# Patient Record
Sex: Male | Born: 1960 | Race: Black or African American | Hispanic: No | Marital: Single | Smoking: Current every day smoker
Health system: Southern US, Community
[De-identification: ages and names within clinical notes are randomized; demographics above are authoritative.]

## PROBLEM LIST (undated history)

## (undated) DIAGNOSIS — I10 Essential (primary) hypertension: Secondary | ICD-10-CM

## (undated) HISTORY — PX: CYST REMOVAL TRUNK: SHX6283

---

## 2005-01-30 ENCOUNTER — Emergency Department (HOSPITAL_COMMUNITY): Admission: EM | Admit: 2005-01-30 | Discharge: 2005-01-30 | Payer: Self-pay | Admitting: Emergency Medicine

## 2005-07-18 ENCOUNTER — Emergency Department (HOSPITAL_COMMUNITY): Admission: EM | Admit: 2005-07-18 | Discharge: 2005-07-18 | Payer: Self-pay | Admitting: Emergency Medicine

## 2005-07-26 ENCOUNTER — Emergency Department (HOSPITAL_COMMUNITY): Admission: EM | Admit: 2005-07-26 | Discharge: 2005-07-26 | Payer: Self-pay | Admitting: Emergency Medicine

## 2014-09-11 ENCOUNTER — Emergency Department (HOSPITAL_COMMUNITY): Payer: Self-pay

## 2014-09-11 ENCOUNTER — Emergency Department (HOSPITAL_COMMUNITY)
Admission: EM | Admit: 2014-09-11 | Discharge: 2014-09-12 | Disposition: A | Discharge status: Home / Self Care | Payer: Self-pay | Attending: Emergency Medicine | Admitting: Emergency Medicine

## 2014-09-11 ENCOUNTER — Encounter (HOSPITAL_COMMUNITY): Payer: Self-pay | Admitting: Emergency Medicine

## 2014-09-11 DIAGNOSIS — I10 Essential (primary) hypertension: Secondary | ICD-10-CM | POA: Insufficient documentation

## 2014-09-11 DIAGNOSIS — R6889 Other general symptoms and signs: Secondary | ICD-10-CM

## 2014-09-11 DIAGNOSIS — R509 Fever, unspecified: Secondary | ICD-10-CM | POA: Insufficient documentation

## 2014-09-11 DIAGNOSIS — R062 Wheezing: Secondary | ICD-10-CM | POA: Insufficient documentation

## 2014-09-11 DIAGNOSIS — R51 Headache: Secondary | ICD-10-CM | POA: Insufficient documentation

## 2014-09-11 DIAGNOSIS — R5383 Other fatigue: Secondary | ICD-10-CM | POA: Insufficient documentation

## 2014-09-11 DIAGNOSIS — R05 Cough: Secondary | ICD-10-CM | POA: Insufficient documentation

## 2014-09-11 DIAGNOSIS — R059 Cough, unspecified: Secondary | ICD-10-CM

## 2014-09-11 DIAGNOSIS — R42 Dizziness and giddiness: Secondary | ICD-10-CM | POA: Insufficient documentation

## 2014-09-11 DIAGNOSIS — Z720 Tobacco use: Secondary | ICD-10-CM | POA: Insufficient documentation

## 2014-09-11 HISTORY — DX: Essential (primary) hypertension: I10

## 2014-09-11 MED ORDER — IPRATROPIUM-ALBUTEROL 0.5-2.5 (3) MG/3ML IN SOLN
3.0000 mL | Freq: Once | RESPIRATORY_TRACT | Status: AC
  Administered 2014-09-12: 3 mL via RESPIRATORY_TRACT
  Filled 2014-09-11: qty 3

## 2014-09-11 NOTE — ED Notes (Signed)
Pt. reports productive cough , headache , fatigue , sneezing and mild dizziness onset this week , denies fever or chills.

## 2014-09-11 NOTE — ED Provider Notes (Signed)
CSN: [id number]     Arrival date & time 09/11/14  2012 History  This chart was scribed for Matthew Booze, MD by Evon Slack, ED Scribe. This patient was seen in room A03C/A03C and the patient's care was started at 11:31 PM.      Chief Complaint  Patient presents with  . Cough  . Headache  . Fatigue  . Dizziness   The history is provided by the patient. No language interpreter was used.   HPI Comments: Matthew Bradley is a 54 y.o. male who presents to the Emergency Department complaining of productive cough of white sputum onset 3 days prior. Pt reports fever, sneezing, HA, intermittent dizziness and fatigue. Pt states that today at work his legs have were unusually sore. Pt doesn't report any medications PTA. Pt states that the cough is worse at night. Pt does report recently being around someone with similar virus as work. Denies chills nausea or vomiting. Pt states that he is currently trying to quit smoking and he is down to about 5 cigarettes per weak.   Past Medical History  Diagnosis Date  . Hypertension    History reviewed. No pertinent past surgical history. No family history on file. Social History  Substance Use Topics  . Smoking status: Current Every Day Smoker  . Smokeless tobacco: None  . Alcohol Use: No    Review of Systems  Constitutional: Positive for fever. Negative for chills.  HENT: Positive for sneezing.   Respiratory: Positive for cough.   Neurological: Positive for dizziness and headaches.  All other systems reviewed and are negative.   Allergies  Review of patient's allergies indicates no known allergies.  Home Medications   Prior to Admission medications   Medication Sig Start Date End Date Taking? Authorizing Provider  naphazoline-pheniramine (NAPHCON-A) 0.025-0.3 % ophthalmic solution Place 1 drop into both eyes 4 (four) times daily as needed for irritation.   Yes Historical Provider, MD   BP 144/96 mmHg  Pulse 98  Temp(Src) 100.1 F (37.8  C) (Oral)  Resp 16  Ht 6' 3.75" (1.924 m)  Wt 272 lb (123.378 kg)  BMI 33.33 kg/m2  SpO2 96%   Physical Exam  Constitutional: He is oriented to person, place, and time. He appears well-developed and well-nourished. No distress.  HENT:  Head: Normocephalic and atraumatic.  Eyes: Conjunctivae and EOM are normal. Pupils are equal, round, and reactive to light.  Neck: Normal range of motion. Neck supple. No JVD present.  Cardiovascular: Normal rate, regular rhythm and normal heart sounds.   No murmur heard. Pulmonary/Chest: Effort normal. No respiratory distress. He has wheezes. He has no rales. He exhibits no tenderness.  Slightly prolonged exhalation phase. minimal wheeze with forced exhalation.   Abdominal: Soft. Bowel sounds are normal. He exhibits no distension and no mass. There is no tenderness.  Musculoskeletal: Normal range of motion. He exhibits no edema.  Lymphadenopathy:    He has no cervical adenopathy.  Neurological: He is alert and oriented to person, place, and time. No cranial nerve deficit. He exhibits normal muscle tone. Coordination normal.  Skin: Skin is warm and dry. No rash noted.  Psychiatric: He has a normal mood and affect. His behavior is normal. Judgment and thought content normal.  Nursing note and vitals reviewed.   ED Course  Procedures (including critical care time) DIAGNOSTIC STUDIES: Oxygen Saturation is 96% on RA, adequate by my interpretation.    COORDINATION OF CARE: 11:36 PM-Discussed treatment plan with pt at bedside and pt  agreed to plan.     Imaging Review Dg Chest 2 View  09/11/2014   CLINICAL DATA:  Productive cough  EXAM: CHEST  2 VIEW  COMPARISON:  None.  FINDINGS: The heart size and mediastinal contours are within normal limits. Both lungs are clear. The visualized skeletal structures are unremarkable. Aortic unfolding noted.  IMPRESSION: No active cardiopulmonary disease.   Electronically Signed   By: Christiana Pellant M.D.   On:  09/11/2014 21:29   I have personally reviewed and evaluated these images as part of my medical decision-making.   MDM   Final diagnoses:  Flu-like symptoms  Cough      Flulike illness. Chest x-ray shows no evidence of pneumonia. He will be given a therapeutic trial of albuterol with ipratropium to see if it helps suppress his cough.   He feels significantly better after albuterol with ipratropium. He is discharged with prescriptions for albuterol, prednisone, and hydrocodone-acetaminophen as a cough suppressant to use at night.  I personally performed the services described in this documentation, which was scribed in my presence. The recorded information has been reviewed and is accurate.       Matthew Booze, MD 09/12/14 [phone]

## 2014-09-12 MED ORDER — PREDNISONE 50 MG PO TABS: 50.0000 mg | ORAL_TABLET | Freq: Every day | ORAL | Status: DC

## 2014-09-12 MED ORDER — HYDROCODONE-ACETAMINOPHEN 5-325 MG PO TABS: 1.0000 | ORAL_TABLET | ORAL | Status: DC | PRN

## 2014-09-12 MED ORDER — ALBUTEROL SULFATE HFA 108 (90 BASE) MCG/ACT IN AERS: 2.0000 | INHALATION_SPRAY | RESPIRATORY_TRACT | Status: DC | PRN

## 2014-09-12 MED ORDER — PREDNISONE 20 MG PO TABS
60.0000 mg | ORAL_TABLET | Freq: Once | ORAL | Status: AC
  Administered 2014-09-12: 60 mg via ORAL
  Filled 2014-09-12: qty 3

## 2014-09-12 NOTE — ED Notes (Signed)
Patient is alert and orientedx4.  Patient was explained discharge instructions and they understood them with no questions.   

## 2014-09-12 NOTE — ED Notes (Signed)
MD aware of BP and temp.  Patient was told to take tylenol or ibuprofen for the low grade temp.  His temperature did go down to 99.1 from 100.1

## 2014-09-12 NOTE — Discharge Instructions (Signed)
Drink plenty of fluids. Take acetaminophen or ibuprofen as needed for fever or aching.  Cough, Adult  A cough is a reflex that helps clear your throat and airways. It can help heal the body or may be a reaction to an irritated airway. A cough may only last 2 or 3 weeks (acute) or may last more than 8 weeks (chronic).  CAUSES Acute cough:  Viral or bacterial infections. Chronic cough:  Infections.  Allergies.  Asthma.  Post-nasal drip.  Smoking.  Heartburn or acid reflux.  Some medicines.  Chronic lung problems (COPD).  Cancer. SYMPTOMS   Cough.  Fever.  Chest pain.  Increased breathing rate.  High-pitched whistling sound when breathing (wheezing).  Colored mucus that you cough up (sputum). TREATMENT   A bacterial cough may be treated with antibiotic medicine.  A viral cough must run its course and will not respond to antibiotics.  Your caregiver may recommend other treatments if you have a chronic cough. HOME CARE INSTRUCTIONS   Only take over-the-counter or prescription medicines for pain, discomfort, or fever as directed by your caregiver. Use cough suppressants only as directed by your caregiver.  Use a cold steam vaporizer or humidifier in your bedroom or home to help loosen secretions.  Sleep in a semi-upright position if your cough is worse at night.  Rest as needed.  Stop smoking if you smoke. SEEK IMMEDIATE MEDICAL CARE IF:   You have pus in your sputum.  Your cough starts to worsen.  You cannot control your cough with suppressants and are losing sleep.  You begin coughing up blood.  You have difficulty breathing.  You develop pain which is getting worse or is uncontrolled with medicine.  You have a fever. MAKE SURE YOU:   Understand these instructions.  Will watch your condition.  Will get help right away if you are not doing well or get worse. Document Released: 06/28/2010 Document Revised: 03/24/2011 Document Reviewed:  06/28/2010 [facility] Inc[facility] Patient Information 2015 [place], [place]. This information is not intended to replace advice given to you by your health care provider. Make sure you discuss any questions you have with your health care provider.  Albuterol inhalation aerosol What is this medicine? ALBUTEROL (al [address] B[address] ter ole) is a bronchodilator. It helps open up the airways in your lungs to make it easier to breathe. This medicine is used to treat and to prevent bronchospasm. This medicine may be used for other purposes; ask your health care provider or pharmacist if you have questions. COMMON BRAND NAME(S): Proair HFA, Proventil, Proventil HFA, Respirol, Ventolin, Ventolin HFA What should I tell my health care provider before I take this medicine? They need to know if you have any of the following conditions: -diabetes -heart disease or irregular heartbeat -high blood pressure -pheochromocytoma -seizures -thyroid disease -an unusual or allergic reaction to albuterol, levalbuterol, sulfites, other medicines, foods, dyes, or preservatives -pregnant or trying to get pregnant -breast-feeding How should I use this medicine? This medicine is for inhalation through the mouth. Follow the directions on your prescription label. Take your medicine at regular intervals. Do not use more often than directed. Make sure that you are using your inhaler correctly. Ask you doctor or health care provider if you have any questions. Talk to your pediatrician regarding the use of this medicine in children. Special care may be needed. Overdosage: If you think you have taken too much of this medicine contact a poison control center or emergency room at once. NOTE: This medicine is  only for you. Do not share this medicine with others. What if I miss a dose? If you miss a dose, use it as soon as you can. If it is almost time for your next dose, use only that dose. Do not use double or extra doses. What may interact with this  medicine? -anti-infectives like chloroquine and pentamidine -caffeine -cisapride -diuretics -medicines for colds -medicines for depression or for emotional or psychotic conditions -medicines for weight loss including some herbal products -methadone -some antibiotics like clarithromycin, erythromycin, levofloxacin, and linezolid -some heart medicines -steroid hormones like dexamethasone, cortisone, hydrocortisone -theophylline -thyroid hormones This list may not describe all possible interactions. Give your health care provider a list of all the medicines, herbs, non-prescription drugs, or dietary supplements you use. Also tell them if you smoke, drink alcohol, or use illegal drugs. Some items may interact with your medicine. What should I watch for while using this medicine? Tell your doctor or health care professional if your symptoms do not improve. Do not use extra albuterol. If your asthma or bronchitis gets worse while you are using this medicine, call your doctor right away. If your mouth gets dry try chewing sugarless gum or sucking hard candy. Drink water as directed. What side effects may I notice from receiving this medicine? Side effects that you should report to your doctor or health care professional as soon as possible: -allergic reactions like skin rash, itching or hives, swelling of the face, lips, or tongue -breathing problems -chest pain -feeling faint or lightheaded, falls -high blood pressure -irregular heartbeat -fever -muscle cramps or weakness -pain, tingling, numbness in the hands or feet -vomiting Side effects that usually do not require medical attention (report to your doctor or health care professional if they continue or are bothersome): -cough -difficulty sleeping -headache -nervousness or trembling -stomach upset -stuffy or runny nose -throat irritation -unusual taste This list may not describe all possible side effects. Call your doctor for  medical advice about side effects. You may report side effects to FDA at 1-800-FDA-1088. Where should I keep my medicine? Keep out of the reach of children. Store at room temperature between 15 and 30 degrees C (59 and 86 degrees F). The contents are under pressure and may burst when exposed to heat or flame. Do not freeze. This medicine does not work as well if it is too cold. Throw away any unused medicine after the expiration date. Inhalers need to be thrown away after the labeled number of puffs have been used or by the expiration date; whichever comes first. Ventolin HFA should be thrown away 12 months after removing from foil pouch. Check the instructions that come with your medicine. NOTE: This sheet is a summary. It may not cover all possible information. If you have questions about this medicine, talk to your doctor, pharmacist, or health care provider.  2015, Elsevier/Gold Standard. (2012-06-17 10:57:17)  Prednisone tablets What is this medicine? PREDNISONE (PRED ni sone) is a corticosteroid. It is commonly used to treat inflammation of the skin, joints, lungs, and other organs. Common conditions treated include asthma, allergies, and arthritis. It is also used for other conditions, such as blood disorders and diseases of the adrenal glands. This medicine may be used for other purposes; ask your health care provider or pharmacist if you have questions. COMMON BRAND NAME(S): Deltasone, Predone, Sterapred, Sterapred DS What should I tell my health care provider before I take this medicine? They need to know if you have any of these conditions: -  Cushing's syndrome -diabetes -glaucoma -heart disease -high blood pressure -infection (especially a virus infection such as chickenpox, cold sores, or herpes) -kidney disease -liver disease -mental illness -myasthenia gravis -osteoporosis -seizures -stomach or intestine problems -thyroid disease -an unusual or allergic reaction to lactose,  prednisone, other medicines, foods, dyes, or preservatives -pregnant or trying to get pregnant -breast-feeding How should I use this medicine? Take this medicine by mouth with a glass of water. Follow the directions on the prescription label. Take this medicine with food. If you are taking this medicine once a day, take it in the morning. Do not take more medicine than you are told to take. Do not suddenly stop taking your medicine because you may develop a severe reaction. Your doctor will tell you how much medicine to take. If your doctor wants you to stop the medicine, the dose may be slowly lowered over time to avoid any side effects. Talk to your pediatrician regarding the use of this medicine in children. Special care may be needed. Overdosage: If you think you have taken too much of this medicine contact a poison control center or emergency room at once. NOTE: This medicine is only for you. Do not share this medicine with others. What if I miss a dose? If you miss a dose, take it as soon as you can. If it is almost time for your next dose, talk to your doctor or health care professional. You may need to miss a dose or take an extra dose. Do not take double or extra doses without advice. What may interact with this medicine? Do not take this medicine with any of the following medications: -metyrapone -mifepristone This medicine may also interact with the following medications: -aminoglutethimide -amphotericin B -aspirin and aspirin-like medicines -barbiturates -certain medicines for diabetes, like glipizide or glyburide -cholestyramine -cholinesterase inhibitors -cyclosporine -digoxin -diuretics -ephedrine -male hormones, like estrogens and birth control pills -isoniazid -ketoconazole -NSAIDS, medicines for pain and inflammation, like ibuprofen or naproxen -phenytoin -rifampin -toxoids -vaccines -warfarin This list may not describe all possible interactions. Give your  health care provider a list of all the medicines, herbs, non-prescription drugs, or dietary supplements you use. Also tell them if you smoke, drink alcohol, or use illegal drugs. Some items may interact with your medicine. What should I watch for while using this medicine? Visit your doctor or health care professional for regular checks on your progress. If you are taking this medicine over a prolonged period, carry an identification card with your name and address, the type and dose of your medicine, and your doctor's name and address. This medicine may increase your risk of getting an infection. Tell your doctor or health care professional if you are around anyone with measles or chickenpox, or if you develop sores or blisters that do not heal properly. If you are going to have surgery, tell your doctor or health care professional that you have taken this medicine within the last twelve months. Ask your doctor or health care professional about your diet. You may need to lower the amount of salt you eat. This medicine may affect blood sugar levels. If you have diabetes, check with your doctor or health care professional before you change your diet or the dose of your diabetic medicine. What side effects may I notice from receiving this medicine? Side effects that you should report to your doctor or health care professional as soon as possible: -allergic reactions like skin rash, itching or hives, swelling of the face, lips, or  tongue -changes in emotions or moods -changes in vision -depressed mood -eye pain -fever or chills, cough, sore throat, pain or difficulty passing urine -increased thirst -swelling of ankles, feet Side effects that usually do not require medical attention (report to your doctor or health care professional if they continue or are bothersome): -confusion, excitement, restlessness -headache -nausea, vomiting -skin problems, acne, thin and shiny skin -trouble  sleeping -weight gain This list may not describe all possible side effects. Call your doctor for medical advice about side effects. You may report side effects to FDA at 1-800-FDA-1088. Where should I keep my medicine? Keep out of the reach of children. Store at room temperature between 15 and 30 degrees C (59 and 86 degrees F). Protect from light. Keep container tightly closed. Throw away any unused medicine after the expiration date. NOTE: This sheet is a summary. It may not cover all possible information. If you have questions about this medicine, talk to your doctor, pharmacist, or health care provider.  2015, Elsevier/Gold Standard. (2010-08-15 10:57:14)  Acetaminophen; Hydrocodone tablets or capsules What is this medicine? ACETAMINOPHEN; HYDROCODONE (a set a MEE noe fen; hye droe KOE done) is a pain reliever. It is used to treat mild to moderate pain. This medicine may be used for other purposes; ask your health care provider or pharmacist if you have questions. COMMON BRAND NAME(S): Anexsia, Bancap HC, Ceta-Plus, Co-Gesic, Comfortpak, Dolagesic, [place], [address]/[employer], [address], Hydrogesic, [place], Lorcet HD, Lorcet Plus, Lortab, Margesic H, Maxidone, [place], Polygesic, [place], [place], Retail [employer], Vicodin, Vicodin ES, Vicodin HP, [place] Baseman What should I tell my health care provider before I take this medicine? They need to know if you have any of these conditions: -brain tumor -Crohn's disease, inflammatory bowel disease, or ulcerative colitis -drug abuse or addiction -head injury -heart or circulation problems -if you often drink alcohol -kidney disease or problems going to the bathroom -liver disease -lung disease, asthma, or breathing problems -an unusual or allergic reaction to acetaminophen, hydrocodone, other opioid analgesics, other medicines, foods, dyes, or preservatives -pregnant or trying to get pregnant -breast-feeding How should I use this medicine? Take this  medicine by mouth. Swallow it with a full glass of water. Follow the directions on the prescription label. If the medicine upsets your stomach, take the medicine with food or milk. Do not take more than you are told to take. Talk to your pediatrician regarding the use of this medicine in children. This medicine is not approved for use in children. Overdosage: If you think you have taken too much of this medicine contact a poison control center or emergency room at once. NOTE: This medicine is only for you. Do not share this medicine with others. What if I miss a dose? If you miss a dose, take it as soon as you can. If it is almost time for your next dose, take only that dose. Do not take double or extra doses. What may interact with this medicine? -alcohol -antihistamines -isoniazid -medicines for depression, anxiety, or psychotic disturbances -medicines for sleep -muscle relaxants -naltrexone -narcotic medicines (opiates) for pain -phenobarbital -ritonavir -tramadol This list may not describe all possible interactions. Give your health care provider a list of all the medicines, herbs, non-prescription drugs, or dietary supplements you use. Also tell them if you smoke, drink alcohol, or use illegal drugs. Some items may interact with your medicine. What should I watch for while using this medicine? Tell your doctor or health care professional if your pain does not go away,  if it gets worse, or if you have new or a different type of pain. You may develop tolerance to the medicine. Tolerance means that you will need a higher dose of the medicine for pain relief. Tolerance is normal and is expected if you take the medicine for a long time. Do not suddenly stop taking your medicine because you may develop a severe reaction. Your body becomes used to the medicine. This does NOT mean you are addicted. Addiction is a behavior related to getting and using a drug for a non-medical reason. If you have  pain, you have a medical reason to take pain medicine. Your doctor will tell you how much medicine to take. If your doctor wants you to stop the medicine, the dose will be slowly lowered over time to avoid any side effects. You may get drowsy or dizzy when you first start taking the medicine or change doses. Do not drive, use machinery, or do anything that may be dangerous until you know how the medicine affects you. Stand or sit up slowly. There are different types of narcotic medicines (opiates) for pain. If you take more than one type at the same time, you may have more side effects. Give your health care provider a list of all medicines you use. Your doctor will tell you how much medicine to take. Do not take more medicine than directed. Call emergency for help if you have problems breathing. The medicine will cause constipation. Try to have a bowel movement at least every 2 to 3 days. If you do not have a bowel movement for 3 days, call your doctor or health care professional. Too much acetaminophen can be very dangerous. Do not take Tylenol (acetaminophen) or medicines that contain acetaminophen with this medicine. Many non-prescription medicines contain acetaminophen. Always read the labels carefully. What side effects may I notice from receiving this medicine? Side effects that you should report to your doctor or health care professional as soon as possible: -allergic reactions like skin rash, itching or hives, swelling of the face, lips, or tongue -breathing problems -confusion -feeling faint or lightheaded, falls -stomach pain -yellowing of the eyes or skin Side effects that usually do not require medical attention (report to your doctor or health care professional if they continue or are bothersome): -nausea, vomiting -stomach upset This list may not describe all possible side effects. Call your doctor for medical advice about side effects. You may report side effects to FDA at  1-800-FDA-1088. Where should I keep my medicine? Keep out of the reach of children. This medicine can be abused. Keep your medicine in a safe place to protect it from theft. Do not share this medicine with anyone. Selling or giving away this medicine is dangerous and against the law. Store at room temperature between 15 and 30 degrees C (59 and 86 degrees F). Protect from light. Keep container tightly closed. Throw away any unused medicine after the expiration date. Discard unused medicine and used packaging carefully. Pets and children can be harmed if they find used or lost packages. NOTE: This sheet is a summary. It may not cover all possible information. If you have questions about this medicine, talk to your doctor, pharmacist, or health care provider.  2015, Elsevier/Gold Standard. (2012-08-23 13:15:56)

## 2015-01-04 ENCOUNTER — Encounter (HOSPITAL_COMMUNITY): Payer: Self-pay | Admitting: Emergency Medicine

## 2015-01-04 ENCOUNTER — Emergency Department (INDEPENDENT_AMBULATORY_CARE_PROVIDER_SITE_OTHER)
Admission: EM | Admit: 2015-01-04 | Discharge: 2015-01-04 | Disposition: A | Discharge status: Home / Self Care | Payer: Self-pay | Source: Home / Self Care

## 2015-01-04 DIAGNOSIS — S93601A Unspecified sprain of right foot, initial encounter: Secondary | ICD-10-CM

## 2015-01-04 DIAGNOSIS — S46911A Strain of unspecified muscle, fascia and tendon at shoulder and upper arm level, right arm, initial encounter: Secondary | ICD-10-CM

## 2015-01-04 MED ORDER — DICLOFENAC SODIUM 1 % TD GEL: 1.0000 "application " | Freq: Four times a day (QID) | TRANSDERMAL | Status: DC

## 2015-01-04 NOTE — ED Notes (Signed)
Pain in root ankle, no known injury.  Current pain for one month.

## 2015-01-04 NOTE — ED Provider Notes (Signed)
CSN: [id number]     Arrival date & time 01/04/15  1300 History   None    Chief Complaint  Patient presents with  . Ankle Pain   (Consider location/radiation/quality/duration/timing/severity/associated sxs/prior Treatment) HPI Comments: 54 year old male resident of [place] [facility] works at the [employer] 7 days a week. He is complaining of right foot pain. Gradual onset approximately one month ago. Denies any trauma. Denies twisting his ankle or other active injury. He states the more he is on his foot the worst the pain. The pain is greatest with the end of the day after working. He points to the right lateral ankle as the source of pain.  He also complains of pain to the bilateral anterior shoulder joints that are sore upon awakening in the morning and just before going to bed at night. No known injury. Cold makes it worse.  Patient is a 54 y.o. male presenting with ankle pain.  Ankle Pain Associated symptoms: no back pain, no fatigue and no fever     Past Medical History  Diagnosis Date  . Hypertension    History reviewed. No pertinent past surgical history. No family history on file. Social History  Substance Use Topics  . Smoking status: Current Every Day Smoker  . Smokeless tobacco: None  . Alcohol Use: No    Review of Systems  Constitutional: Positive for activity change. Negative for fever and fatigue.  HENT: Negative.   Respiratory: Negative.   Gastrointestinal: Negative.   Genitourinary: Negative.   Musculoskeletal: Negative for myalgias and back pain.       As per HPI  Skin: Negative.   Neurological: Negative for dizziness, weakness, numbness and headaches.    Allergies  Review of patient's allergies indicates no known allergies.  Home Medications   Prior to Admission medications   Medication Sig Start Date End Date Taking? Authorizing Provider  albuterol (PROVENTIL HFA;VENTOLIN HFA) 108 (90 BASE) MCG/ACT inhaler Inhale 2 puffs into the lungs every 4  (four) hours as needed for wheezing or shortness of breath (or coughing). 09/12/14   Dione Boozeavid Glick, MD  diclofenac sodium (VOLTAREN) 1 % GEL Apply 1 application topically 4 (four) times daily. 01/04/15   Hayden Rasmussenavid Vincenzo Stave, NP  HYDROcodone-acetaminophen (NORCO) 5-325 MG per tablet Take 1 tablet by mouth every 4 (four) hours as needed for moderate pain (or coughing). 09/12/14   Dione Boozeavid Glick, MD  naphazoline-pheniramine (NAPHCON-A) 0.025-0.3 % ophthalmic solution Place 1 drop into both eyes 4 (four) times daily as needed for irritation.    Historical Provider, MD  predniSONE (DELTASONE) 50 MG tablet Take 1 tablet (50 mg total) by mouth daily. Patient not taking: Reported on 01/04/2015 09/12/14   Dione Boozeavid Glick, MD   Meds Ordered and Administered this Visit  Medications - No data to display  BP 186/95 mmHg  Pulse 76  Temp(Src) 98.5 F (36.9 C) (Oral)  Resp 18  SpO2 96% No data found.   Physical Exam  Constitutional: He appears well-developed and well-nourished. No distress.  Neck: Normal range of motion. Neck supple.  Cardiovascular: Normal rate.   Pulmonary/Chest: Effort normal. No respiratory distress.  Musculoskeletal: Normal range of motion. He exhibits tenderness.  Tenderness over the talofibular ligament and the Perroneus brevis tendons. No direct bony tenderness. No tenderness to the foot or digits. Patient demonstrates full range of motion of the ankle with plantar and dorsiflexion intact. Inversion produces pain in the above areas. Minimal swelling. No deformity.  Mild tenderness to the anterior shoulder joints bilaterally. These include  the tendons associated with the pectoralis major musculature. No other joint tenderness.  Neurological: He is alert. No cranial nerve deficit. Coordination normal.  Skin: Skin is warm.  Psychiatric: He has a normal mood and affect.  Nursing note and vitals reviewed.   ED Course  Procedures (including critical care time)  Labs Review Labs Reviewed - No data  to display  Imaging Review No results found.   Visual Acuity Review  Right Eye Distance:   Left Eye Distance:   Bilateral Distance:    Right Eye Near:   Left Eye Near:    Bilateral Near:         MDM   1. Foot sprain, right, initial encounter   2. Shoulder strain, right, initial encounter    ASO Limit wt bearing Elevate, dicolfenac gel qid      Hayden Rasmussen, NP 01/04/15 2034

## 2015-01-04 NOTE — Discharge Instructions (Signed)

## 2016-03-31 ENCOUNTER — Emergency Department (HOSPITAL_COMMUNITY)
Admission: EM | Admit: 2016-03-31 | Discharge: 2016-03-31 | Disposition: A | Discharge status: Home / Self Care | Payer: Self-pay | Attending: Emergency Medicine | Admitting: Emergency Medicine

## 2016-03-31 ENCOUNTER — Emergency Department (HOSPITAL_COMMUNITY): Payer: Self-pay

## 2016-03-31 ENCOUNTER — Encounter (HOSPITAL_COMMUNITY): Payer: Self-pay

## 2016-03-31 DIAGNOSIS — J181 Lobar pneumonia, unspecified organism: Secondary | ICD-10-CM | POA: Insufficient documentation

## 2016-03-31 DIAGNOSIS — Z87891 Personal history of nicotine dependence: Secondary | ICD-10-CM | POA: Insufficient documentation

## 2016-03-31 DIAGNOSIS — Z91040 Latex allergy status: Secondary | ICD-10-CM | POA: Insufficient documentation

## 2016-03-31 DIAGNOSIS — J189 Pneumonia, unspecified organism: Secondary | ICD-10-CM

## 2016-03-31 DIAGNOSIS — I10 Essential (primary) hypertension: Secondary | ICD-10-CM | POA: Insufficient documentation

## 2016-03-31 MED ORDER — AZITHROMYCIN 250 MG PO TABS: ORAL_TABLET | ORAL | 0 refills | Status: DC

## 2016-03-31 MED ORDER — AZITHROMYCIN 250 MG PO TABS
500.0000 mg | ORAL_TABLET | Freq: Once | ORAL | Status: AC
  Administered 2016-03-31: 500 mg via ORAL
  Filled 2016-03-31: qty 2

## 2016-03-31 NOTE — ED Provider Notes (Signed)
[facility] Provider Note   CSN: [id number] Arrival date & time: 03/31/16  1132  By signing my name below, I, Matthew Bradley, attest that this documentation has been prepared under the direction and in the presence of Audry Pili, PA-C. Electronically Signed: Doreatha Bradley, ED Scribe. 03/31/16. 1:17 PM.    History   Chief Complaint Chief Complaint  Patient presents with  . Cough    HPI Tracker Matthew Bradley is a 56 y.o. male who presents to the Emergency Department complaining of intermittent, productive cough for a month with associated rhinorrhea, nasal congestion. Pt states his nasal discharge was initially clear and became green/yellow a few days ago. Pt has been taking Mucinex and Nyquil with inadequate relief. No worsening factors noted. He reports multiple recent sick contacts. He denies sore throat, fever.    The history is provided by the patient. No language interpreter was used.    Past Medical History:  Diagnosis Date  . Hypertension     There are no active problems to display for this patient.   History reviewed. No pertinent surgical history.     Home Medications    Prior to Admission medications   Medication Sig Start Date End Date Taking? Authorizing Provider  guaiFENesin (MUCINEX) 600 MG 12 hr tablet Take 600 mg by mouth daily as needed for cough.   Yes Historical Provider, MD  Pseudoeph-Doxylamine-DM-APAP (NYQUIL PO) Take 2 capsules by mouth at bedtime as needed (cough).   Yes Historical Provider, MD  Throat Lozenges (LOZENGES MT) Use as directed 1-4 lozenges in the mouth or throat daily as needed (cough).   Yes Historical Provider, MD  albuterol (PROVENTIL HFA;VENTOLIN HFA) 108 (90 BASE) MCG/ACT inhaler Inhale 2 puffs into the lungs every 4 (four) hours as needed for wheezing or shortness of breath (or coughing). Patient not taking: Reported on 03/31/2016 09/12/14   [name], MD    Family History History reviewed. No pertinent family history.  Social  History Social History  Substance Use Topics  . Smoking status: Former Smoker    Quit date: 02/01/2016  . Smokeless tobacco: Never Used  . Alcohol use No     Allergies   Latex   Review of Systems Review of Systems  Constitutional: Negative for fever.  HENT: Positive for congestion and rhinorrhea. Negative for sore throat.   Respiratory: Positive for cough.      Physical Exam Updated Vital Signs BP (!) 168/112 (BP Location: Left Arm)   Pulse 74   Temp 98.4 F (36.9 C) (Oral)   Resp 16   Ht 6\' 3"  (1.905 m)   Wt 270 lb (122.5 kg)   SpO2 98%   BMI 33.75 kg/m   Physical Exam  Constitutional: He is oriented to person, place, and time. He appears well-developed and well-nourished. No distress.  HENT:  Head: Normocephalic and atraumatic.  Right Ear: Tympanic membrane, external ear and ear canal normal.  Left Ear: Tympanic membrane, external ear and ear canal normal.  Nose: Nose normal.  Mouth/Throat: Uvula is midline, oropharynx is clear and moist and mucous membranes are normal. No trismus in the jaw. No oropharyngeal exudate, posterior oropharyngeal erythema or tonsillar abscesses.  Eyes: EOM are normal. Pupils are equal, round, and reactive to light.  Neck: Normal range of motion. Neck supple. No tracheal deviation present.  Cardiovascular: Normal rate, regular rhythm, S1 normal, S2 normal, normal heart sounds, intact distal pulses and normal pulses.   Pulmonary/Chest: Effort normal and breath sounds normal. No respiratory distress. He  has no decreased breath sounds. He has no wheezes. He has no rhonchi. He has no rales.  Abdominal: Normal appearance and bowel sounds are normal. There is no tenderness.  Musculoskeletal: Normal range of motion.  Neurological: He is alert and oriented to person, place, and time.  Skin: Skin is warm and dry.  Psychiatric: He has a normal mood and affect. His speech is normal and behavior is normal. Thought content normal.     ED  Treatments / Results   DIAGNOSTIC STUDIES: Oxygen Saturation is 98% on RA, normal by my interpretation.    COORDINATION OF CARE: 1:14 PM Discussed treatment plan with pt at bedside which includes CXR, antibiotics and pt agreed to plan.   Radiology Dg Chest 2 View  Result Date: 03/31/2016 CLINICAL DATA:  Productive cough over the last week. EXAM: CHEST  2 VIEW COMPARISON:  09/11/2014 FINDINGS: Heart size is normal. There is unfolding of the thoracic aorta. There is mild patchy infiltrate in the left lower lobe consistent with mild pneumonia. The right lung is clear. No effusions. No significant bone finding. IMPRESSION: Mild and patchy left lower lobe pneumonia. Electronically Signed   By: Paulina FusiMark  Shogry M.D.   On: 03/31/2016 13:42    Procedures Procedures (including critical care time)  Medications Ordered in ED Medications - No data to display   Initial Impression / Assessment and Plan / ED Course  I have reviewed the triage vital signs and the nursing notes.  Pertinent imaging results that were available during my care of the patient were reviewed by me and considered in my medical decision making (see chart for details).  I have reviewed and evaluated the relevant imaging studies. I have reviewed the relevant previous healthcare records. I obtained HPI from historian.  ED Course: CXR  Assessment: CXR shows evidence of left lower lobe PNA. Pt afebrile, no tachycardia. Normal tensive. Will d/c home with azithromycin. Conservative therapy indicated. Recommend close follow up with PCP. Strict return precautions given.   Disposition/Plan:  Prescribed azithromycin. Additional Verbal discharge instructions given and discussed with patient. Pt Instructed to f/u with PCP in the next week for evaluation and treatment of symptoms. Return precautions given Pt acknowledges and agrees with plan  Supervising Physician Jacalyn LefevreJulie Haviland, MD    Final Clinical Impressions(s) / ED Diagnoses    Final diagnoses:  Community acquired pneumonia of left lower lobe of lung [facility](HCC)    New Prescriptions New Prescriptions   No medications on file    I personally performed the services described in this documentation, which was scribed in my presence. The recorded information has been reviewed and is accurate.    Audry Piliyler Seibert Keeter, PA-C 04/03/16 [id number]    Jacalyn LefevreJulie Haviland, MD 04/03/16 [phone]

## 2016-03-31 NOTE — Discharge Instructions (Addendum)
Please read and follow all provided instructions.  Your diagnoses today include:  1. Community acquired pneumonia of left lower lobe of lung (HCC)     Tests performed today include: Chest x-ray -- shows pneumonia Vital signs. See below for your results today.   Medications prescribed:   Take any prescribed medications only as directed.  Home care instructions:  Follow any educational materials contained in this packet.  Take the complete course of antibiotics that you were prescribed.   BE VERY CAREFUL not to take multiple medicines containing Tylenol (also called acetaminophen). Doing so can lead to an overdose which can damage your liver and cause liver failure and possibly death.   Follow-up instructions: Please follow-up with your primary care provider in the next 3 days for further evaluation of your symptoms and to ensure resolution of your infection.   Return instructions:  Please return to the Emergency Department if you experience worsening symptoms.  Return immediately with worsening breathing, worsening shortness of breath, or if you feel it is taking you more effort to breathe.  Please return if you have any other emergent concerns.  Additional Information:  Your vital signs today were: BP (!) 168/112 (BP Location: Left Arm)    Pulse 74    Temp 98.4 F (36.9 C) (Oral)    Resp 16    Ht 6\' 3"  (1.905 m)    Wt 122.5 kg    SpO2 98%    BMI 33.75 kg/m  If your blood pressure (BP) was elevated above 135/85 this visit, please have this repeated by your doctor within one month. --------------

## 2016-03-31 NOTE — ED Triage Notes (Signed)
Pt endorses productive green/yellow cough x 1 month. Denies chills/fever. Denies shob. Has been taking mucinex and nyquil with some relief. VSS.

## 2016-03-31 NOTE — ED Notes (Signed)
Patient called to room with no answer

## 2018-01-23 ENCOUNTER — Encounter (HOSPITAL_COMMUNITY): Payer: Self-pay | Admitting: *Deleted

## 2018-01-23 ENCOUNTER — Ambulatory Visit (HOSPITAL_COMMUNITY)
Admission: EM | Admit: 2018-01-23 | Discharge: 2018-01-23 | Disposition: A | Discharge status: Home / Self Care | Payer: 59 | Attending: Family Medicine | Admitting: Family Medicine

## 2018-01-23 ENCOUNTER — Telehealth (HOSPITAL_COMMUNITY): Payer: Self-pay | Admitting: *Deleted

## 2018-01-23 ENCOUNTER — Other Ambulatory Visit: Payer: Self-pay

## 2018-01-23 ENCOUNTER — Ambulatory Visit (INDEPENDENT_AMBULATORY_CARE_PROVIDER_SITE_OTHER): Payer: 59

## 2018-01-23 DIAGNOSIS — J209 Acute bronchitis, unspecified: Secondary | ICD-10-CM

## 2018-01-23 DIAGNOSIS — J4541 Moderate persistent asthma with (acute) exacerbation: Secondary | ICD-10-CM | POA: Diagnosis not present

## 2018-01-23 DIAGNOSIS — R0602 Shortness of breath: Secondary | ICD-10-CM | POA: Diagnosis not present

## 2018-01-23 DIAGNOSIS — R05 Cough: Secondary | ICD-10-CM | POA: Diagnosis not present

## 2018-01-23 MED ORDER — ALBUTEROL SULFATE HFA 108 (90 BASE) MCG/ACT IN AERS: 2.0000 | INHALATION_SPRAY | RESPIRATORY_TRACT | 0 refills | Status: DC | PRN

## 2018-01-23 MED ORDER — PREDNISONE 50 MG PO TABS: ORAL_TABLET | ORAL | 1 refills | Status: DC

## 2018-01-23 MED ORDER — BENZONATATE 100 MG PO CAPS: 100.0000 mg | ORAL_CAPSULE | Freq: Three times a day (TID) | ORAL | 0 refills | Status: DC | PRN

## 2018-01-23 NOTE — Telephone Encounter (Signed)
Per pharmacy, pt wanted to fill albuterol Rx at [facility] on [address]; pharmacy called [facility] [address] to transfer albuterol Rx, but was told it was never received by them.  V.O. given for albuterol Rx.

## 2018-01-23 NOTE — ED Triage Notes (Signed)
C/O cough x 3 wks with gradual worsening; now also having fatigue.

## 2018-01-23 NOTE — ED Provider Notes (Signed)
[facility]    CSN: [id number] Arrival date & time: 01/23/18  1011     History   Chief Complaint No chief complaint on file.   HPI Matthew Bradley is a 58 y.o. male.   This is a 58 year old man who complains of cough x 3 wks with gradual worsening; now also having fatigue.  Patient does smoke, but has no history of asthma.  There is minimal amount of phlegm production, no shortness of breath, no chest pain, and no hemoptysis.  Patient says that he does not have much in the way of sinus congestion.  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     Past Medical History:  Diagnosis Date  . Hypertension     There are no active problems to display for this patient.   History reviewed. No pertinent surgical history.     Home Medications    Prior to Admission medications   Medication Sig Start Date End Date Taking? Authorizing Provider  albuterol (PROVENTIL HFA;VENTOLIN HFA) 108 (90 Base) MCG/ACT inhaler Inhale 2 puffs into the lungs every 4 (four) hours as needed for wheezing or shortness of breath (or coughing). 01/23/18   Elvina Sidle, MD  benzonatate (TESSALON) 100 MG capsule Take 1-2 capsules (100-200 mg total) by mouth 3 (three) times daily as needed for cough. 01/23/18   Elvina Sidle, MD  predniSONE (DELTASONE) 50 MG tablet One daily with food 01/23/18   Elvina Sidle, MD    Family History History reviewed. No pertinent family history.  Social History Social History   Tobacco Use  . Smoking status: Current Every Day Smoker    Last attempt to quit: 02/01/2016    Years since quitting: 1.9  . Smokeless tobacco: Never Used  Substance Use Topics  . Alcohol use: Yes    Comment: occasionally  . Drug use: No     Allergies   Other and Latex   Review of Systems Review of Systems   Physical Exam Triage Vital Signs ED Triage Vitals  Enc Vitals Group     BP 01/23/18 1040 (!) 171/108     Pulse Rate  01/23/18 1040 90     Resp 01/23/18 1040 20     Temp 01/23/18 1040 98.2 F (36.8 C)     Temp Source 01/23/18 1040 Oral     SpO2 01/23/18 1040 95 %     Weight --      Height --      Head Circumference --      Peak Flow --      Pain Score 01/23/18 1041 0     Pain Loc --      Pain Edu? --      Excl. in GC? --    No data found.  Updated Vital Signs BP (!) 166/104 (BP Location: Left Arm)   Pulse 90   Temp 98.2 F (36.8 C) (Oral)   Resp 20   SpO2 95%    Physical Exam Vitals signs and nursing note reviewed.  Constitutional:      Appearance: Normal appearance.  HENT:     Head: Normocephalic and atraumatic.     Right Ear: External ear normal.     Left Ear: External ear normal.     Nose: Nose normal.     Mouth/Throat:     Mouth: Mucous membranes are moist.     Pharynx: Oropharynx is clear.  Eyes:     Conjunctiva/sclera: Conjunctivae normal.  Neck:     Musculoskeletal: Normal range of motion and neck supple.  Cardiovascular:     Rate and Rhythm: Normal rate.     Heart sounds: Normal heart sounds.  Pulmonary:     Effort: Pulmonary effort is normal.     Breath sounds: Rhonchi and rales present.     Comments: Patient has a few fine dry rales  Patient also has a deep sonorous rhonchi on both lung fields Musculoskeletal: Normal range of motion.  Skin:    General: Skin is warm and dry.  Neurological:     General: No focal deficit present.     Mental Status: He is alert.  Psychiatric:        Mood and Affect: Mood normal.      UC Treatments / Results  Labs (all labs ordered are listed, but only abnormal results are displayed) Labs Reviewed - No data to display  EKG None  Radiology Dg Chest 2 View  Result Date: 01/23/2018 CLINICAL DATA:  Productive cough for 3 weeks. Shortness of breath. Smoker EXAM: CHEST - 2 VIEW COMPARISON:  03/31/2016 FINDINGS: The heart size is normal. Ectasia of thoracic aorta is stable. Both lungs are clear. The visualized skeletal  structures are unremarkable. IMPRESSION: No active cardiopulmonary disease. Electronically Signed   By: Myles RosenthalJohn  Stahl M.D.   On: 01/23/2018 11:18    Procedures Procedures (including critical care time)  Medications Ordered in UC Medications - No data to display  Initial Impression / Assessment and Plan / UC Course  I have reviewed the triage vital signs and the nursing notes.  Pertinent labs & imaging results that were available during my care of the patient were reviewed by me and considered in my medical decision making (see chart for details).    Final Clinical Impressions(s) / UC Diagnoses   Final diagnoses:  Moderate persistent reactive airway disease with acute exacerbation  Acute bronchitis, unspecified organism   Discharge Instructions   None    ED Prescriptions    Medication Sig Dispense Auth. Provider   predniSONE (DELTASONE) 50 MG tablet One daily with food 5 tablet Elvina SidleLauenstein, Roger Fasnacht, MD   benzonatate (TESSALON) 100 MG capsule Take 1-2 capsules (100-200 mg total) by mouth 3 (three) times daily as needed for cough. 40 capsule Elvina SidleLauenstein, Langston Summerfield, MD   albuterol (PROVENTIL HFA;VENTOLIN HFA) 108 (90 Base) MCG/ACT inhaler Inhale 2 puffs into the lungs every 4 (four) hours as needed for wheezing or shortness of breath (or coughing). 1 Inhaler Elvina SidleLauenstein, Careena Degraffenreid, MD     Controlled Substance Prescriptions [place] Controlled Substance Registry consulted? Not Applicable   Elvina SidleLauenstein, Zanyla Klebba, MD 01/23/18 1238

## 2018-04-23 ENCOUNTER — Other Ambulatory Visit: Payer: Self-pay

## 2018-04-23 ENCOUNTER — Encounter (HOSPITAL_COMMUNITY): Payer: Self-pay

## 2018-04-23 ENCOUNTER — Ambulatory Visit (HOSPITAL_COMMUNITY)
Admission: EM | Admit: 2018-04-23 | Discharge: 2018-04-23 | Disposition: A | Discharge status: Home / Self Care | Payer: 59 | Attending: Family Medicine | Admitting: Family Medicine

## 2018-04-23 ENCOUNTER — Ambulatory Visit (INDEPENDENT_AMBULATORY_CARE_PROVIDER_SITE_OTHER): Payer: 59

## 2018-04-23 DIAGNOSIS — M25512 Pain in left shoulder: Secondary | ICD-10-CM | POA: Diagnosis not present

## 2018-04-23 DIAGNOSIS — W19XXXA Unspecified fall, initial encounter: Secondary | ICD-10-CM | POA: Diagnosis not present

## 2018-04-23 DIAGNOSIS — S4992XA Unspecified injury of left shoulder and upper arm, initial encounter: Secondary | ICD-10-CM | POA: Diagnosis not present

## 2018-04-23 DIAGNOSIS — M19012 Primary osteoarthritis, left shoulder: Secondary | ICD-10-CM

## 2018-04-23 DIAGNOSIS — Z760 Encounter for issue of repeat prescription: Secondary | ICD-10-CM

## 2018-04-23 MED ORDER — PREDNISONE 20 MG PO TABS: ORAL_TABLET | ORAL | 0 refills | Status: DC

## 2018-04-23 MED ORDER — ALBUTEROL SULFATE HFA 108 (90 BASE) MCG/ACT IN AERS: 2.0000 | INHALATION_SPRAY | RESPIRATORY_TRACT | 0 refills | Status: DC | PRN

## 2018-04-23 MED ORDER — DICLOFENAC SODIUM 1 % TD GEL: 4.0000 g | Freq: Four times a day (QID) | TRANSDERMAL | 0 refills | Status: AC

## 2018-04-23 MED ORDER — TRAMADOL HCL 50 MG PO TABS: 50.0000 mg | ORAL_TABLET | Freq: Four times a day (QID) | ORAL | 0 refills | Status: DC | PRN

## 2018-04-23 NOTE — Discharge Instructions (Signed)
Follow-up with [facility] if symptoms worsen or do not improve. You may take over the counter ibuprofen or tylenol for mild to moderate pain. Take tramadol for severe pain only as needed.

## 2018-04-23 NOTE — ED Provider Notes (Signed)
[facility]    CSN: [id number] Arrival date & time: 04/23/18  [id number]     History   Chief Complaint Chief Complaint  Patient presents with  . Shoulder Pain    HPI Matthew Bradley is a 58 y.o. male.   HPI Patient is here today with a complaint of left shoulder pain since last night after sustaining a fall from a stool. 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 He is concern for fracture as he suffered excruciating pain last night. Patient is unable to extend left arm or internal rotated shoulder. No prior injuries to left arm or shoulder. He has not attempted relief with any home medications, warm, or cool compresses.  Past Medical History:  Diagnosis Date  . Hypertension     There are no active problems to display for this patient.   History reviewed. No pertinent surgical history.   Home Medications    Prior to Admission medications   Medication Sig Start Date End Date Taking? Authorizing Provider  albuterol (PROVENTIL HFA;VENTOLIN HFA) 108 (90 Base) MCG/ACT inhaler Inhale 2 puffs into the lungs every 4 (four) hours as needed for wheezing or shortness of breath (or coughing). 04/23/18   Bing NeighborsHarris, Uziel Covault S, FNP  benzonatate (TESSALON) 100 MG capsule Take 1-2 capsules (100-200 mg total) by mouth 3 (three) times daily as needed for cough. 01/23/18   Elvina SidleLauenstein, Kurt, MD  diclofenac sodium (VOLTAREN) 1 % GEL Apply 4 g topically 4 (four) times daily. 04/23/18   Bing NeighborsHarris, Oluwaseun Cremer S, FNP  predniSONE (DELTASONE) 20 MG tablet Take 3 PO QAM x3days, 2 PO QAM x3days, 1 PO QAM x3days 04/23/18   Bing NeighborsHarris, Kalana Yust S, FNP  traMADol (ULTRAM) 50 MG tablet Take 1 tablet (50 mg total) by mouth every 6 (six) hours as needed. 04/23/18   Bing NeighborsHarris, Rayven Rettig S, FNP    Family History Family History  Problem Relation Age of Onset  . Diabetes Mother   . Heart failure Father     Social History Social History   Tobacco Use  . Smoking status: Current Every Day Smoker    Last  attempt to quit: 02/01/2016    Years since quitting: 2.2  . Smokeless tobacco: Never Used  Substance Use Topics  . Alcohol use: Yes    Comment: occasionally  . Drug use: No     Allergies   Other and Latex   Review of Systems Review of Systems Pertinent negatives listed in HPI  Physical Exam Triage Vital Signs ED Triage Vitals  Enc Vitals Group     BP 04/23/18 0928 (!) 173/129     Pulse Rate 04/23/18 0928 94     Resp 04/23/18 0928 18     Temp 04/23/18 0928 98.4 F (36.9 C)     Temp src --      SpO2 04/23/18 0928 97 %     Weight --      Height --      Head Circumference --      Peak Flow --      Pain Score 04/23/18 0927 10     Pain Loc --      Pain Edu? --      Excl. in GC? --    No data found.  Updated Vital Signs BP (!) 173/129 (BP Location: Right Arm)   Pulse 94   Temp 98.4 F (36.9 C)   Resp 18   SpO2 97%   Visual Acuity Right Eye Distance:   Left  Eye Distance:   Bilateral Distance:    Right Eye Near:   Left Eye Near:    Bilateral Near:     Physical Exam General appearance: alert, well developed, well nourished, cooperative and in no distress Head: Normocephalic, without obvious abnormality, atraumatic Respiratory: Respirations even and unlabored, normal respiratory rate Heart: rate and rhythm normal. No gallop or murmurs noted on exam  Abdomen: BS +, no distention, no rebound tenderness, or no mass Extremities: Left shoulder impaired ROM. Tenderness palpation of acromioclavicular (AC) joint  Skin: Skin color, texture, turgor normal. No rashes seen  Psych: Appropriate mood and affect. Neurologic: Mental status: Alert, oriented to person, place, and time, thought content appropriate.  UC Treatments / Results  Labs (all labs ordered are listed, but only abnormal results are displayed) Labs Reviewed - No data to display  EKG None  Radiology Dg Shoulder Left  Result Date: 04/23/2018 CLINICAL DATA:  Fall.  Left shoulder pain EXAM: LEFT  SHOULDER - 2+ VIEW COMPARISON:  None. FINDINGS: Normal alignment no fracture. Moderate degenerative change and spurring in the [facility] joint. Mild degenerative change and spurring in the greater tuberosity. IMPRESSION: Negative for fracture. Electronically Signed   By: Marlan Palau M.D.   On: 04/23/2018 10:12    Procedures Procedures (including critical care time)  Medications Ordered in UC Medications - No data to display  Initial Impression / Assessment and Plan / UC Course  I have reviewed the triage vital signs and the nursing notes.  Pertinent labs & imaging results that were available during my care of the patient were reviewed by me and considered in my medical decision making (see chart for details).    [name] presents today with a complaint of left shoulder pain after sustaining a fall less than 24 hours ago. Imaging of the shoulder is negative of any acute changes, however is significant for degenerative changes. Patient reports knowledge of a prior diagnosis of OA affecting the left shoulder. We agreed to place left arm in a sling to promote rest. Start prednisone taper to reduce inflammation. Prescribed diclofenac topical gel and a short course of tramadol for pain. Patient given information to follow-up with [facility] if symptoms worsen or do not improve. Patient verbalized understanding and agreement with plan.  Final Clinical Impressions(s) / UC Diagnoses   Final diagnoses:  Injury of left shoulder, initial encounter  Primary osteoarthritis of left shoulder  Medication refill     Discharge Instructions     Follow-up with [facility] if symptoms worsen or do not improve. You may take over the counter ibuprofen or tylenol for mild to moderate pain. Take tramadol for severe pain only as needed.   ED Prescriptions    Medication Sig Dispense Auth. Provider   albuterol (PROVENTIL HFA;VENTOLIN HFA) 108 (90 Base) MCG/ACT inhaler Inhale 2 puffs into the  lungs every 4 (four) hours as needed for wheezing or shortness of breath (or coughing). 1 Inhaler Bing Neighbors, FNP   predniSONE (DELTASONE) 20 MG tablet Take 3 PO QAM x3days, 2 PO QAM x3days, 1 PO QAM x3days 18 tablet Bing Neighbors, FNP   traMADol (ULTRAM) 50 MG tablet Take 1 tablet (50 mg total) by mouth every 6 (six) hours as needed. 15 tablet Bing Neighbors, FNP   diclofenac sodium (VOLTAREN) 1 % GEL Apply 4 g topically 4 (four) times daily. 100 g Bing Neighbors, FNP     Controlled Substance Prescriptions [place] Controlled Substance Registry consulted? Yes, I have consulted  the [place] Controlled Substances Registry for this patient, and feel the risk/benefit ratio today is favorable for proceeding with this prescription for a controlled substance.   Bing Neighbors, [place] 04/25/18 [phone]

## 2018-04-23 NOTE — ED Triage Notes (Signed)
Fell off step stool and landed on left shoulder yesterday. States same shoulder was injured years ago

## 2018-04-23 NOTE — ED Notes (Signed)
Patient verbalizes understanding of discharge instructions. Opportunity for questioning and answers were provided. Patient discharged from [facility] by RN.  

## 2018-05-05 DIAGNOSIS — M75112 Incomplete rotator cuff tear or rupture of left shoulder, not specified as traumatic: Secondary | ICD-10-CM | POA: Diagnosis not present

## 2018-05-05 DIAGNOSIS — M25512 Pain in left shoulder: Secondary | ICD-10-CM | POA: Diagnosis not present

## 2018-05-14 DIAGNOSIS — M25512 Pain in left shoulder: Secondary | ICD-10-CM | POA: Diagnosis not present

## 2018-05-30 ENCOUNTER — Other Ambulatory Visit: Payer: Self-pay | Admitting: Family Medicine

## 2018-05-31 DIAGNOSIS — M75122 Complete rotator cuff tear or rupture of left shoulder, not specified as traumatic: Secondary | ICD-10-CM | POA: Diagnosis not present

## 2019-02-22 ENCOUNTER — Other Ambulatory Visit: Payer: Self-pay

## 2019-02-22 ENCOUNTER — Ambulatory Visit (HOSPITAL_COMMUNITY)
Admission: EM | Admit: 2019-02-22 | Discharge: 2019-02-22 | Disposition: A | Discharge status: Home / Self Care | Payer: 59 | Attending: Family Medicine | Admitting: Family Medicine

## 2019-02-22 ENCOUNTER — Encounter (HOSPITAL_COMMUNITY): Payer: Self-pay

## 2019-02-22 DIAGNOSIS — R030 Elevated blood-pressure reading, without diagnosis of hypertension: Secondary | ICD-10-CM

## 2019-02-22 DIAGNOSIS — S96912A Strain of unspecified muscle and tendon at ankle and foot level, left foot, initial encounter: Secondary | ICD-10-CM | POA: Diagnosis not present

## 2019-02-22 MED ORDER — DICLOFENAC SODIUM 75 MG PO TBEC: 75.0000 mg | DELAYED_RELEASE_TABLET | Freq: Two times a day (BID) | ORAL | 0 refills | Status: DC

## 2019-02-22 NOTE — ED Triage Notes (Signed)
Patient presents to Urgent Care with complaints of tweaking his right ankle since a few days ago. Patient reports he is still able to walk on it but it hurts too badly for him to work.

## 2019-02-22 NOTE — ED Provider Notes (Signed)
[facility]   [id number] 02/22/19 Arrival Time: 1059  ASSESSMENT & PLAN:  1. Strain of left ankle, initial encounter   2. Elevated blood pressure reading without diagnosis of hypertension     No indication for ankle imaging. Possible early tendonitis; h/o similar.  Begin: Meds ordered this encounter  Medications  . diclofenac (VOLTAREN) 75 MG EC tablet    Sig: Take 1 tablet (75 mg total) by mouth 2 (two) times daily.    Dispense:  14 tablet    Refill:  0   Has ASO brace to wear. Work note provided.    Discharge Instructions     Your blood pressure was noted to be elevated during your visit today. You may return here within the next few days to recheck if unable to see your primary care doctor. If your blood pressure remains persistently elevated, you may need to begin taking a medication.  BP (!) 151/95 (BP Location: Left Arm)   Pulse 80   Temp 98.9 F (37.2 C) (Oral)   Resp 16   SpO2 99%       Recommend: Follow-up Information    Schedule an appointment as soon as possible for a visit  with Primary Care at [facility].   Specialty: Family Medicine Why: To recheck your blood pressure. Contact information: [address] [place].   Why: If your ankle is not improving within one week. Contact information: [address] [place] [phone]          Reviewed expectations re: course of current medical issues. Questions answered. Outlined signs and symptoms indicating need for more acute intervention. Patient verbalized understanding. After Visit Summary given.  SUBJECTIVE: History from: patient. Matthew Bradley is a 59 y.o. male who reports fairly persistent medial R ankle pain. Noted a few days ago; restraining animal at work and "felt a twinge in my ankle when I stood up". More pain since, esp with prolonged  weight bearing and certain movements. Has been wearing ASO; mild help. No OTC medications. No extremity sensation changes or weakness. No direct ankle trauma.  History reviewed. No pertinent surgical history.    OBJECTIVE:  Vitals:   02/22/19 1202  BP: (!) 151/95  Pulse: 80  Resp: 16  Temp: 98.9 F (37.2 C)  TempSrc: Oral  SpO2: 99%    General appearance: alert; no distress HEENT: [place]; AT Neck: supple with FROM Resp: unlabored respirations Extremities: . RLE: warm with well perfused appearance; poorly localized mild to moderate tenderness over right medial ankle; without gross deformities; swelling: none; bruising: none; ankle ROM: normal CV: brisk extremity capillary refill of RLE; 2+ DP pulse of RLE. Skin: warm and dry; no visible rashes Neurologic: gait normal; normal sensation and strength of RLE Psychological: alert and cooperative; normal mood and affect    Allergies  Allergen Reactions  . Other     Some metals  . Latex Rash    Past Medical History:  Diagnosis Date  . Hypertension    Social History   Socioeconomic History  . Marital status: Single    Spouse name: Not on file  . Number of children: Not on file  . Years of education: Not on file  . Highest education level: Not on file  Occupational History  . Not on file  Tobacco Use  . Smoking status: Current Every Day Smoker  Packs/day: 0.30    Types: Cigarettes  . Smokeless tobacco: Never Used  Substance and Sexual Activity  . Alcohol use: Yes    Comment: occasionally  . Drug use: No  . Sexual activity: Not on file  Other Topics Concern  . Not on file  Social History Narrative  . Not on file   Social Determinants of Health   Financial Resource Strain:   . Difficulty of Paying Living Expenses: Not on file  Food Insecurity:   . Worried About Programme researcher, broadcasting/film/video in the Last Year: Not on file  . Ran Out of Food in the Last Year: Not on file  Transportation Needs:   . Lack of Transportation  (Medical): Not on file  . Lack of Transportation (Non-Medical): Not on file  Physical Activity:   . Days of Exercise per Week: Not on file  . Minutes of Exercise per Session: Not on file  Stress:   . Feeling of Stress : Not on file  Social Connections:   . Frequency of Communication with Friends and Family: Not on file  . Frequency of Social Gatherings with Friends and Family: Not on file  . Attends Religious Services: Not on file  . Active Member of Clubs or Organizations: Not on file  . Attends Banker Meetings: Not on file  . Marital Status: Not on file   Family History  Problem Relation Age of Onset  . Diabetes Mother   . Heart failure Father    History reviewed. No pertinent surgical history.    Mardella Layman, MD 02/22/19 1231

## 2019-02-22 NOTE — Discharge Instructions (Addendum)
Your blood pressure was noted to be elevated during your visit today. You may return here within the next few days to recheck if unable to see your primary care doctor. If your blood pressure remains persistently elevated, you may need to begin taking a medication.  BP (!) 151/95 (BP Location: Left Arm)   Pulse 80   Temp 98.9 F (37.2 C) (Oral)   Resp 16   SpO2 99%

## 2019-04-15 ENCOUNTER — Ambulatory Visit (HOSPITAL_COMMUNITY)
Admission: EM | Admit: 2019-04-15 | Discharge: 2019-04-15 | Disposition: A | Discharge status: Home / Self Care | Payer: 59 | Attending: Family Medicine | Admitting: Family Medicine

## 2019-04-15 ENCOUNTER — Encounter (HOSPITAL_COMMUNITY): Payer: Self-pay

## 2019-04-15 ENCOUNTER — Other Ambulatory Visit: Payer: Self-pay

## 2019-04-15 DIAGNOSIS — M791 Myalgia, unspecified site: Secondary | ICD-10-CM

## 2019-04-15 MED ORDER — ALBUTEROL SULFATE HFA 108 (90 BASE) MCG/ACT IN AERS: 2.0000 | INHALATION_SPRAY | RESPIRATORY_TRACT | 11 refills | Status: DC | PRN

## 2019-04-15 NOTE — ED Triage Notes (Signed)
Pt is here wanting a work note to go abck to work after taking the COVID vaccine last Saturday, states he did NOT feel well after it but feels much better. Pt is denying all symptoms today.

## 2019-04-15 NOTE — ED Provider Notes (Signed)
[place]    CSN: [id number] Arrival date & time: 04/15/19  1057      History   Chief Complaint Chief Complaint  Patient presents with  . Work Note    HPI Matthew Bradley is a 59 y.o. male.   Established [place] patient  Pt is here wanting a work note to go abck to work after taking the COVID vaccine last Saturday, states he did NOT feel well after it but feels much better. Pt is denying all symptoms today.   He works at the [employer].     Past Medical History:  Diagnosis Date  . Hypertension     There are no problems to display for this patient.   History reviewed. No pertinent surgical history.     Home Medications    Prior to Admission medications   Medication Sig Start Date End Date Taking? Authorizing Provider  albuterol (PROVENTIL HFA;VENTOLIN HFA) 108 (90 Base) MCG/ACT inhaler Inhale 2 puffs into the lungs every 4 (four) hours as needed for wheezing or shortness of breath (or coughing). 04/23/18   Scot Jun, FNP  amoxicillin-clavulanate (AUGMENTIN) 875-125 MG tablet Take 1 tablet by mouth 2 (two) times daily. 12/24/18   [provider]  benzonatate (TESSALON) 100 MG capsule Take 1-2 capsules (100-200 mg total) by mouth 3 (three) times daily as needed for cough. 01/23/18   Robyn Haber, MD  diclofenac (VOLTAREN) 75 MG EC tablet Take 1 tablet (75 mg total) by mouth 2 (two) times daily. 02/22/19   Vanessa Kick, MD  diclofenac sodium (VOLTAREN) 1 % GEL Apply 4 g topically 4 (four) times daily. 04/23/18   Scot Jun, FNP  predniSONE (DELTASONE) 20 MG tablet Take 3 PO QAM x3days, 2 PO QAM x3days, 1 PO QAM x3days 04/23/18   Scot Jun, FNP  traMADol (ULTRAM) 50 MG tablet Take 1 tablet (50 mg total) by mouth every 6 (six) hours as needed. 04/23/18   Scot Jun, FNP    Family History Family History  Problem Relation Age of Onset  . Diabetes Mother   . Heart failure Father     Social History Social  History   Tobacco Use  . Smoking status: Current Every Day Smoker    Packs/day: 0.30    Types: Cigarettes  . Smokeless tobacco: Never Used  Substance Use Topics  . Alcohol use: Yes    Comment: occasionally  . Drug use: No     Allergies   Other and Latex   Review of Systems Review of Systems   Physical Exam Triage Vital Signs ED Triage Vitals  Enc Vitals Group     BP 04/15/19 1118 (!) 166/100     Pulse Rate 04/15/19 1118 95     Resp 04/15/19 1118 18     Temp 04/15/19 1118 98.2 F (36.8 C)     Temp Source 04/15/19 1118 Oral     SpO2 04/15/19 1118 95 %     Weight 04/15/19 1116 249 lb 12.8 oz (113.3 kg)     Height --      Head Circumference --      Peak Flow --      Pain Score 04/15/19 1116 0     Pain Loc --      Pain Edu? --      Excl. in [place]? --    No data found.  Updated Vital Signs BP (!) 166/100 (BP Location: Right Arm)   Pulse 95   Temp  98.2 F (36.8 C) (Oral)   Resp 18   Wt 113.3 kg   SpO2 95%   BMI 31.22 kg/m    Physical Exam Vitals and nursing note reviewed.  Constitutional:      General: He is not in acute distress.    Appearance: Normal appearance. He is normal weight. He is not toxic-appearing.  Eyes:     Conjunctiva/sclera: Conjunctivae normal.  Cardiovascular:     Rate and Rhythm: Normal rate.  Pulmonary:     Effort: Pulmonary effort is normal.  Musculoskeletal:        General: Normal range of motion.     Cervical back: Normal range of motion and neck supple.  Skin:    General: Skin is warm and dry.  Neurological:     General: No focal deficit present.     Mental Status: He is alert.  Psychiatric:        Mood and Affect: Mood normal.        Behavior: Behavior normal.        Thought Content: Thought content normal.        Judgment: Judgment normal.      UC Treatments / Results  Labs (all labs ordered are listed, but only abnormal results are displayed) Labs Reviewed - No data to display  EKG   Radiology No results  found.  Procedures Procedures (including critical care time)  Medications Ordered in UC Medications - No data to display  Initial Impression / Assessment and Plan / UC Course  I have reviewed the triage vital signs and the nursing notes.  Pertinent labs & imaging results that were available during my care of the patient were reviewed by me and considered in my medical decision making (see chart for details).    Final Clinical Impressions(s) / UC Diagnoses   Final diagnoses:  None   Discharge Instructions   None    ED Prescriptions    None     PDMP not reviewed this encounter.   Elvina Sidle, MD 04/15/19 1131

## 2019-08-02 ENCOUNTER — Ambulatory Visit (HOSPITAL_COMMUNITY)
Admission: EM | Admit: 2019-08-02 | Discharge: 2019-08-02 | Disposition: A | Discharge status: Home / Self Care | Payer: 59 | Attending: Family Medicine | Admitting: Family Medicine

## 2019-08-02 ENCOUNTER — Other Ambulatory Visit: Payer: Self-pay

## 2019-08-02 ENCOUNTER — Encounter (HOSPITAL_COMMUNITY): Payer: Self-pay

## 2019-08-02 DIAGNOSIS — G8929 Other chronic pain: Secondary | ICD-10-CM

## 2019-08-02 DIAGNOSIS — R1032 Left lower quadrant pain: Secondary | ICD-10-CM | POA: Diagnosis not present

## 2019-08-02 NOTE — ED Triage Notes (Signed)
Pt states he has a previous left groin injury from playing sports in high school. PT c/o 3/10 sharp left groin painx3 wks. Pt states the groin pain was worse.

## 2019-08-02 NOTE — ED Provider Notes (Signed)
[facility]    CSN: [id number] Arrival date & time: 08/02/19  1017      History   Chief Complaint Chief Complaint  Patient presents with  . Groin Pain    HPI Matthew Bradley is a 59 y.o. male.   Patient is a 59 year old male past medical history of hypertension.  He presents today with chronic left groin pain.  This has been an issue for him off and on for many years.  Reinjured back in November/December.  Reporting he slipped and did a split stretching out the groin muscle.  He felt better after about 4 days of rest ibuprofen.  About 1 month ago he started having recurrence of the scrotal pain.  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  The pain is mostly with getting up in the morning and gets better throughout the day.  Ibuprofen helps.  He is here today for request of FMLA paperwork.  No fevers, chills, dysuria, hematuria, urinary frequency.  No testicle pain or swelling.  ROS per HPI      Past Medical History:  Diagnosis Date  . Hypertension     There are no problems to display for this patient.   History reviewed. No pertinent surgical history.     Home Medications    Prior to Admission medications   Medication Sig Start Date End Date Taking? Authorizing Provider  albuterol (VENTOLIN HFA) 108 (90 Base) MCG/ACT inhaler Inhale 2 puffs into the lungs every 4 (four) hours as needed for wheezing or shortness of breath (or coughing). 04/15/19   Elvina Sidle, MD  diclofenac sodium (VOLTAREN) 1 % GEL Apply 4 g topically 4 (four) times daily. 04/23/18   Bing Neighbors, FNP    Family History Family History  Problem Relation Age of Onset  . Diabetes Mother   . Heart failure Father     Social History Social History   Tobacco Use  . Smoking status: Current Every Day Smoker    Packs/day: 0.30    Types: Cigarettes  . Smokeless tobacco: Never Used  Vaping Use  . Vaping Use:  Never used  Substance Use Topics  . Alcohol use: Yes    Comment: occasionally  . Drug use: No     Allergies   Other and Latex   Review of Systems Review of Systems   Physical Exam Triage Vital Signs ED Triage Vitals  Enc Vitals Group     BP 08/02/19 1026 (!) 149/94     Pulse Rate 08/02/19 1026 (!) 103     Resp 08/02/19 1026 16     Temp 08/02/19 1026 99 F (37.2 C)     Temp Source 08/02/19 1026 Oral     SpO2 08/02/19 1026 99 %     Weight 08/02/19 1028 249 lb (112.9 kg)     Height 08/02/19 1028 6\' 3"  (1.905 m)     Head Circumference --      Peak Flow --      Pain Score 08/02/19 1028 3     Pain Loc --      Pain Edu? --      Excl. in [place]? --    No data found.  Updated Vital Signs BP (!) 149/94   Pulse (!) 103   Temp 99 F (37.2 C) (Oral)   Resp 16   Ht 6\' 3"  (1.905 m)   Wt 249  lb (112.9 kg)   SpO2 99%   BMI 31.12 kg/m   Visual Acuity Right Eye Distance:   Left Eye Distance:   Bilateral Distance:    Right Eye Near:   Left Eye Near:    Bilateral Near:     Physical Exam Vitals and nursing note reviewed.  Constitutional:      Appearance: Normal appearance.  HENT:     Head: Normocephalic and atraumatic.     Nose: Nose normal.  Eyes:     Conjunctiva/sclera: Conjunctivae normal.  Pulmonary:     Effort: Pulmonary effort is normal.  Musculoskeletal:        General: Normal range of motion.     Cervical back: Normal range of motion.  Skin:    General: Skin is warm and dry.  Neurological:     Mental Status: He is alert.  Psychiatric:        Mood and Affect: Mood normal.      UC Treatments / Results  Labs (all labs ordered are listed, but only abnormal results are displayed) Labs Reviewed - No data to display  EKG   Radiology No results found.  Procedures Procedures (including critical care time)  Medications Ordered in UC Medications - No data to display  Initial Impression / Assessment and Plan / UC Course  I have reviewed the  triage vital signs and the nursing notes.  Pertinent labs & imaging results that were available during my care of the patient were reviewed by me and considered in my medical decision making (see chart for details).     Chronic groin pain Pt made aware that we do not do FMLA paperwork here.  Sending to sports med for follow up Also gave contact for primary care for follow up.  Ibuprofen as needed.  Final Clinical Impressions(s) / UC Diagnoses   Final diagnoses:  Groin pain, chronic, left     Discharge Instructions     Contact given for primary care for follow-up.  Also put in referral for sports medicine for follow-up You can take ibuprofen for pain as needed    ED Prescriptions    None     PDMP not reviewed this encounter.   Janace Aris, NP 08/02/19 1115

## 2019-08-02 NOTE — Discharge Instructions (Addendum)
Contact given for primary care for follow-up.  Also put in referral for sports medicine for follow-up You can take ibuprofen for pain as needed

## 2019-08-08 ENCOUNTER — Ambulatory Visit (INDEPENDENT_AMBULATORY_CARE_PROVIDER_SITE_OTHER): Payer: 59 | Admitting: Family Medicine

## 2019-08-08 ENCOUNTER — Encounter: Payer: Self-pay | Admitting: Family Medicine

## 2019-08-08 ENCOUNTER — Other Ambulatory Visit: Payer: Self-pay

## 2019-08-08 VITALS — BP 166/100 | Ht 75.75 in | Wt 250.0 lb

## 2019-08-08 DIAGNOSIS — M25552 Pain in left hip: Secondary | ICD-10-CM

## 2019-08-08 NOTE — Progress Notes (Signed)
PCP: Patient, No Pcp Per  Subjective:   HPI: Patient is a 59 y.o. male here for left groin pain.  Patient reports his pain in left groin started back in December or January when he was going down a muddy hill and slipped doing a half splint. He states it feels like he aggravated an old groin injury he had several years ago. He been taking ibuprofen and over the past 3 weeks has been resting including from work. He feels like this is significantly improved today and currently has no pain. He states when pain does come on can last anywhere from 5 minutes to a whole day. Has not noticed any bulge or swelling in the left groin region. Pain is primarily over left anterior proximal hip. He denies any dysuria, hematuria, constipation, diarrhea, hematochezia, melena.  Past Medical History:  Diagnosis Date  . Hypertension     Current Outpatient Medications on File Prior to Visit  Medication Sig Dispense Refill  . albuterol (VENTOLIN HFA) 108 (90 Base) MCG/ACT inhaler Inhale 2 puffs into the lungs every 4 (four) hours as needed for wheezing or shortness of breath (or coughing). 6.7 g 11  . diclofenac sodium (VOLTAREN) 1 % GEL Apply 4 g topically 4 (four) times daily. 100 g 0   No current facility-administered medications on file prior to visit.    History reviewed. No pertinent surgical history.  Allergies  Allergen Reactions  . Other     Some metals  . Latex Rash    Social History   Socioeconomic History  . Marital status: Single    Spouse name: Not on file  . Number of children: Not on file  . Years of education: Not on file  . Highest education level: Not on file  Occupational History  . Not on file  Tobacco Use  . Smoking status: Current Every Day Smoker    Packs/day: 0.30    Types: Cigarettes  . Smokeless tobacco: Never Used  Vaping Use  . Vaping Use: Never used  Substance and Sexual Activity  . Alcohol use: Yes    Comment: occasionally  . Drug use: No  . Sexual  activity: Not Currently  Other Topics Concern  . Not on file  Social History Narrative  . Not on file   Social Determinants of Health   Financial Resource Strain:   . Difficulty of Paying Living Expenses:   Food Insecurity:   . Worried About Programme researcher, broadcasting/film/video in the Last Year:   . Barista in the Last Year:   Transportation Needs:   . Freight forwarder (Medical):   Marland Kitchen Lack of Transportation (Non-Medical):   Physical Activity:   . Days of Exercise per Week:   . Minutes of Exercise per Session:   Stress:   . Feeling of Stress :   Social Connections:   . Frequency of Communication with Friends and Family:   . Frequency of Social Gatherings with Friends and Family:   . Attends Religious Services:   . Active Member of Clubs or Organizations:   . Attends Banker Meetings:   Marland Kitchen Marital Status:   Intimate Partner Violence:   . Fear of Current or Ex-Partner:   . Emotionally Abused:   Marland Kitchen Physically Abused:   . Sexually Abused:     Family History  Problem Relation Age of Onset  . Diabetes Mother   . Heart failure Father     BP (!) 166/100   Ht  6' 3.75" (1.924 m)   Wt (!) 250 lb (113.4 kg)   BMI 30.63 kg/m   Review of Systems: See HPI above.     Objective:  Physical Exam:  Gen: NAD, comfortable in exam room  Left hip: No deformity, muscle defect. FROM with 5/5 strength including hip flexion, adduction, abduction. No tenderness to palpation. NVI distally. Negative logroll, faber, fadir.   Assessment & Plan:  1.  Left hip pain: Patient's exam is reassuring today.  He points over the left hip flexor though does admit pain is much improved after 3 weeks of rest from work.  We reviewed some hip flexor and abductor strengthening exercises and stretches.  He is encouraged to use ice, Tylenol, Voltaren gel or Aleve.  Work restrictions will be provided to limit lifting.  Follow-up in 1 month for reevaluation.  Consider physical therapy if  struggling.

## 2019-08-08 NOTE — Patient Instructions (Signed)
You strained either your hip flexor or groin. Do home exercises and stretches as directed daily - this is very important. Icing 15 minutes at a time 3-4 times a day as needed. Work restrictions as noted. Tylenol, voltaren gel, aleve may help with the pain if needed. Follow up with me in 1 month for reevaluation. Consider formal physical therapy if you're struggling. Make sure you establish with a family doctor - your blood pressure is very elevated today at 166/100.

## 2019-09-05 ENCOUNTER — Ambulatory Visit: Payer: 59 | Admitting: Family Medicine

## 2019-10-28 ENCOUNTER — Other Ambulatory Visit: Payer: Self-pay

## 2019-10-28 ENCOUNTER — Ambulatory Visit (HOSPITAL_COMMUNITY)
Admission: EM | Admit: 2019-10-28 | Discharge: 2019-10-28 | Disposition: A | Discharge status: Home / Self Care | Payer: Self-pay | Attending: Internal Medicine | Admitting: Internal Medicine

## 2019-10-28 ENCOUNTER — Ambulatory Visit (INDEPENDENT_AMBULATORY_CARE_PROVIDER_SITE_OTHER): Payer: Self-pay

## 2019-10-28 ENCOUNTER — Encounter (HOSPITAL_COMMUNITY): Payer: Self-pay

## 2019-10-28 DIAGNOSIS — M25571 Pain in right ankle and joints of right foot: Secondary | ICD-10-CM | POA: Insufficient documentation

## 2019-10-28 LAB — COMPREHENSIVE METABOLIC PANEL
ALT: 18 U/L (ref 0–44)
AST: 23 U/L (ref 15–41)
Albumin: 3.6 g/dL (ref 3.5–5.0)
Alkaline Phosphatase: 58 U/L (ref 38–126)
Anion gap: 14 (ref 5–15)
BUN: 5 mg/dL — ABNORMAL LOW (ref 6–20)
CO2: 24 mmol/L (ref 22–32)
Calcium: 10.5 mg/dL — ABNORMAL HIGH (ref 8.9–10.3)
Chloride: 104 mmol/L (ref 98–111)
Creatinine, Ser: 0.94 mg/dL (ref 0.61–1.24)
GFR, Estimated: >60 mL/min (ref >60)
Glucose, Bld: 78 mg/dL (ref 70–99)
Potassium: 3.9 mmol/L (ref 3.5–5.1)
Sodium: 142 mmol/L (ref 135–145)
Total Bilirubin: 0.8 mg/dL (ref 0.3–1.2)
Total Protein: 8.7 g/dL — ABNORMAL HIGH (ref 6.5–8.1)

## 2019-10-28 LAB — C-REACTIVE PROTEIN: CRP: 0.5 mg/dL (ref <1.0)

## 2019-10-28 LAB — CBC WITH DIFFERENTIAL/PLATELET
Abs Immature Granulocytes: 0.01 10*3/uL (ref 0.00–0.07)
Basophils Absolute: 0.1 10*3/uL (ref 0.0–0.1)
Basophils Relative: 1 %
Eosinophils Absolute: 0 10*3/uL (ref 0.0–0.5)
Eosinophils Relative: 0 %
HCT: 45.5 % (ref 39.0–52.0)
Hemoglobin: 14.8 g/dL (ref 13.0–17.0)
Immature Granulocytes: 0 %
Lymphocytes Relative: 29 %
Lymphs Abs: 1.4 10*3/uL (ref 0.7–4.0)
MCH: 31.3 pg (ref 26.0–34.0)
MCHC: 32.5 g/dL (ref 30.0–36.0)
MCV: 96.2 fL (ref 80.0–100.0)
Monocytes Absolute: 0.3 10*3/uL (ref 0.1–1.0)
Monocytes Relative: 6 %
Neutro Abs: 3.1 10*3/uL (ref 1.7–7.7)
Neutrophils Relative %: 64 %
Platelets: 267 10*3/uL (ref 150–400)
RBC: 4.73 MIL/uL (ref 4.22–5.81)
RDW: 14.6 % (ref 11.5–15.5)
WBC: 4.8 10*3/uL (ref 4.0–10.5)
nRBC: 0 % (ref 0.0–0.2)

## 2019-10-28 LAB — URIC ACID: Uric Acid, Serum: 8.5 mg/dL (ref 3.7–8.6)

## 2019-10-28 LAB — SEDIMENTATION RATE: Sed Rate: 40 mm/hr — ABNORMAL HIGH (ref 0–16)

## 2019-10-28 MED ORDER — PREDNISONE 10 MG (21) PO TBPK: ORAL_TABLET | ORAL | 0 refills | Status: DC

## 2019-10-28 NOTE — ED Triage Notes (Signed)
Pt states he twisted his ankle stepping off a curb about 4 weeks ago and "walked it off". Pt states over the last several weeks he has it has become more painful and his ankle is swollen but the swelling has moved to his foot. Pt is aox4 and ambulates with crutches.

## 2019-10-28 NOTE — Discharge Instructions (Addendum)
There were some concerns on xray due to bone loss. Like we spoke about this can be caused by gout, arthritis, some inflammatory problem.  No evidence of fracture.   We are checking some blood work and will have to follow up with primary care doctor.  Treating with prednisone daily for 6 days.  Cam walker boot given here.  Rest, ice elevate the foot. I will call you with any abnormal blood work.

## 2019-10-30 ENCOUNTER — Telehealth (HOSPITAL_COMMUNITY): Payer: Self-pay | Admitting: Emergency Medicine

## 2019-10-30 MED ORDER — PREDNISONE 10 MG (21) PO TBPK: ORAL_TABLET | ORAL | 0 refills | Status: DC

## 2019-10-30 NOTE — Telephone Encounter (Signed)
Resending rx from prior visit

## 2019-10-31 NOTE — ED Provider Notes (Signed)
[facility]    CSN: [id number] Arrival date & time: 10/28/19  1005      History   Chief Complaint Chief Complaint  Patient presents with  . Ankle Pain    x 4 weeks ago    HPI Matthew Bradley is a 59 y.o. male.   Patient is a 59 year old male presents today for right ankle pain, swelling.  Reporting that he stepped off a curb about 4 weeks ago and twisted the left ankle.  Over the last few weeks it has become painful and swollen.  He is able to ambulate.  No numbness, tingling or weakness.  No bruising or deformities.     Past Medical History:  Diagnosis Date  . Hypertension     There are no problems to display for this patient.   History reviewed. No pertinent surgical history.     Home Medications    Prior to Admission medications   Medication Sig Start Date End Date Taking? Authorizing Provider  albuterol (VENTOLIN HFA) 108 (90 Base) MCG/ACT inhaler Inhale 2 puffs into the lungs every 4 (four) hours as needed for wheezing or shortness of breath (or coughing). 04/15/19   Elvina Sidle, MD  diclofenac sodium (VOLTAREN) 1 % GEL Apply 4 g topically 4 (four) times daily. 04/23/18   Bing Neighbors, FNP  predniSONE (STERAPRED UNI-PAK 21 TAB) 10 MG (21) TBPK tablet 6 tabs for 1 day, then 5 tabs for 1 das, then 4 tabs for 1 day, then 3 tabs for 1 day, 2 tabs for 1 day, then 1 tab for 1 day 10/30/19   Wieters, Paoli C, PA-C    Family History Family History  Problem Relation Age of Onset  . Diabetes Mother   . Heart failure Father     Social History Social History   Tobacco Use  . Smoking status: Current Every Day Smoker    Packs/day: 0.30    Types: Cigarettes  . Smokeless tobacco: Never Used  Vaping Use  . Vaping Use: Never used  Substance Use Topics  . Alcohol use: Yes    Comment: occasionally  . Drug use: No     Allergies   Other and Latex   Review of Systems Review of Systems   Physical Exam Triage Vital Signs ED Triage  Vitals  Enc Vitals Group     BP 10/28/19 1140 (!) 150/87     Pulse Rate 10/28/19 1140 92     Resp 10/28/19 1140 17     Temp 10/28/19 1140 98.2 F (36.8 C)     Temp Source 10/28/19 1140 Oral     SpO2 10/28/19 1140 97 %     Weight --      Height --      Head Circumference --      Peak Flow --      Pain Score 10/28/19 1142 8     Pain Loc --      Pain Edu? --      Excl. in GC? --    No data found.  Updated Vital Signs BP (!) 150/87 (BP Location: Right Arm)   Pulse 92   Temp 98.2 F (36.8 C) (Oral)   Resp 17   SpO2 97%   Visual Acuity Right Eye Distance:   Left Eye Distance:   Bilateral Distance:    Right Eye Near:   Left Eye Near:    Bilateral Near:     Physical Exam Vitals and nursing note reviewed.  Constitutional:  Appearance: Normal appearance.  HENT:     Head: Normocephalic and atraumatic.     Nose: Nose normal.  Eyes:     Conjunctiva/sclera: Conjunctivae normal.  Pulmonary:     Effort: Pulmonary effort is normal.  Musculoskeletal:     Cervical back: Normal range of motion.     Right ankle: Swelling present. Tenderness present over the lateral malleolus. Decreased range of motion.  Skin:    General: Skin is warm and dry.  Neurological:     Mental Status: He is alert.  Psychiatric:        Mood and Affect: Mood normal.      UC Treatments / Results  Labs (all labs ordered are listed, but only abnormal results are displayed) Labs Reviewed  COMPREHENSIVE METABOLIC PANEL - Abnormal; Notable for the following components:      Result Value   BUN 5 (*)    Calcium 10.5 (*)    Total Protein 8.7 (*)    All other components within normal limits  SEDIMENTATION RATE - Abnormal; Notable for the following components:   Sed Rate 40 (*)    All other components within normal limits  CBC WITH DIFFERENTIAL/PLATELET  C-REACTIVE PROTEIN  URIC ACID    EKG   Radiology No results found.  Procedures Procedures (including critical care  time)  Medications Ordered in UC Medications - No data to display  Initial Impression / Assessment and Plan / UC Course  I have reviewed the triage vital signs and the nursing notes.  Pertinent labs & imaging results that were available during my care of the patient were reviewed by me and considered in my medical decision making (see chart for details).     Right ankle pain X-ray with concerns for bone loss caused by gout, arthritis or some sort of inflammatory process. No evidence of fracture on x-ray Blood work pending checking some inflammatory markers, uric acid and basic blood work. Will treat with prednisone over the next 6 days. Cam walker given to wear Rest, ice, elevate. Contact given for primary care for follow-up.  Patient may need to see a specialist. Final Clinical Impressions(s) / UC Diagnoses   Final diagnoses:  Acute right ankle pain     Discharge Instructions     There were some concerns on xray due to bone loss. Like we spoke about this can be caused by gout, arthritis, some inflammatory problem.  No evidence of fracture.   We are checking some blood work and will have to follow up with primary care doctor.  Treating with prednisone daily for 6 days.  Cam walker boot given here.  Rest, ice elevate the foot. I will call you with any abnormal blood work.     ED Prescriptions    Medication Sig Dispense Auth. Provider   predniSONE (STERAPRED UNI-PAK 21 TAB) 10 MG (21) TBPK tablet 6 tabs for 1 day, then 5 tabs for 1 das, then 4 tabs for 1 day, then 3 tabs for 1 day, 2 tabs for 1 day, then 1 tab for 1 day 21 tablet Santanna Olenik A, NP     PDMP not reviewed this encounter.   Janace Aris, NP 10/31/19 [phone]

## 2019-11-30 ENCOUNTER — Ambulatory Visit: Payer: Self-pay | Admitting: Internal Medicine

## 2020-07-27 ENCOUNTER — Other Ambulatory Visit: Payer: Self-pay

## 2020-07-27 ENCOUNTER — Encounter (HOSPITAL_COMMUNITY): Payer: Self-pay

## 2020-07-27 ENCOUNTER — Ambulatory Visit (HOSPITAL_COMMUNITY)
Admission: EM | Admit: 2020-07-27 | Discharge: 2020-07-27 | Disposition: A | Discharge status: Home / Self Care | Payer: 59 | Attending: Physician Assistant | Admitting: Physician Assistant

## 2020-07-27 DIAGNOSIS — L02213 Cutaneous abscess of chest wall: Secondary | ICD-10-CM

## 2020-07-27 DIAGNOSIS — Z23 Encounter for immunization: Secondary | ICD-10-CM | POA: Diagnosis not present

## 2020-07-27 MED ORDER — DOXYCYCLINE HYCLATE 100 MG PO CAPS: 100.0000 mg | ORAL_CAPSULE | Freq: Two times a day (BID) | ORAL | 0 refills | Status: DC

## 2020-07-27 MED ORDER — TETANUS-DIPHTH-ACELL PERTUSSIS 5-2.5-18.5 LF-MCG/0.5 IM SUSY
0.5000 mL | PREFILLED_SYRINGE | Freq: Once | INTRAMUSCULAR | Status: AC
  Administered 2020-07-27: 0.5 mL via INTRAMUSCULAR

## 2020-07-27 MED ORDER — LIDOCAINE HCL (PF) 2 % IJ SOLN
INTRAMUSCULAR | Status: AC
  Filled 2020-07-27: qty 5

## 2020-07-27 MED ORDER — MUPIROCIN 2 % EX OINT: 1.0000 "application " | TOPICAL_OINTMENT | Freq: Every day | CUTANEOUS | 0 refills | Status: DC

## 2020-07-27 MED ORDER — TETANUS-DIPHTH-ACELL PERTUSSIS 5-2.5-18.5 LF-MCG/0.5 IM SUSY
PREFILLED_SYRINGE | INTRAMUSCULAR | Status: AC
  Filled 2020-07-27: qty 0.5

## 2020-07-27 NOTE — Discharge Instructions (Addendum)
Keep area clean.  Apply Bactroban daily.  Start doxycycline 100 mg twice daily for 10 days.  This can make you sensitive to the sun so stay out of the sun while on it.  You can use Tylenol and ibuprofen for pain relief.  If you have any worsening symptoms including increased pain, recurrent swelling, fevers, streaking from wound, nausea, vomiting you need to be reevaluated.

## 2020-07-27 NOTE — ED Triage Notes (Signed)
Pt presents with c/o an abscess on the left side of the chest area. States it started off as a pimple on Sunday and got bigger.

## 2020-07-27 NOTE — ED Provider Notes (Signed)
[facility]    CSN: [id number] Arrival date & time: 07/27/20  1215      History   Chief Complaint Chief Complaint  Patient presents with   Abscess    HPI Matthew Bradley is a 60 y.o. male.   Patient presents today with a 1 week history of increasing abscess on his left chest wall.  Reports symptoms began as a small pimple but he tried to drain this and area has gradually been enlarging.  He reports pain is rated as rated 5 on a 0-10 pain scale, localized to affected area without radiation, described as throbbing, worse with palpation, no alleviating factors notified.  Patient has been applying topical boil ease without improvement of symptoms.  Denies any recent antibiotic use.  He does have a history of recurrent boils but does not had any lesions drained in the past.  Denies history of MRSA, diabetes, immunosuppression.  He denies any fevers, nausea, vomiting, body aches.   Past Medical History:  Diagnosis Date   Hypertension     There are no problems to display for this patient.   History reviewed. No pertinent surgical history.     Home Medications    Prior to Admission medications   Medication Sig Start Date End Date Taking? Authorizing Provider  doxycycline (VIBRAMYCIN) 100 MG capsule Take 1 capsule (100 mg total) by mouth 2 (two) times daily. 07/27/20  Yes Sicilia Killough, Denny Peon K, PA-C  mupirocin ointment (BACTROBAN) 2 % Apply 1 application topically daily. 07/27/20  Yes Burnett Spray K, PA-C  albuterol (VENTOLIN HFA) 108 (90 Base) MCG/ACT inhaler Inhale 2 puffs into the lungs every 4 (four) hours as needed for wheezing or shortness of breath (or coughing). 04/15/19   Elvina Sidle, MD  diclofenac sodium (VOLTAREN) 1 % GEL Apply 4 g topically 4 (four) times daily. 04/23/18   Bing Neighbors, FNP  predniSONE (STERAPRED UNI-PAK 21 TAB) 10 MG (21) TBPK tablet 6 tabs for 1 day, then 5 tabs for 1 das, then 4 tabs for 1 day, then 3 tabs for 1 day, 2 tabs for 1 day, then  1 tab for 1 day 10/30/19   Wieters, Dunseith C, PA-C    Family History Family History  Problem Relation Age of Onset   Diabetes Mother    Heart failure Father     Social History Social History   Tobacco Use   Smoking status: Every Day    Packs/day: 0.30    Types: Cigarettes   Smokeless tobacco: Never  Vaping Use   Vaping Use: Never used  Substance Use Topics   Alcohol use: Yes    Comment: occasionally   Drug use: No     Allergies   Other and Latex   Review of Systems Review of Systems  Constitutional:  Negative for activity change, appetite change, fatigue and fever.  Respiratory:  Negative for cough and shortness of breath.   Cardiovascular:  Negative for chest pain.  Gastrointestinal:  Negative for abdominal pain, diarrhea, nausea and vomiting.  Skin:  Positive for color change and wound.  Neurological:  Negative for dizziness, weakness, light-headedness, numbness and headaches.    Physical Exam Triage Vital Signs ED Triage Vitals  Enc Vitals Group     BP 07/27/20 1514 (!) 165/88     Pulse Rate 07/27/20 1514 84     Resp 07/27/20 1514 16     Temp 07/27/20 1514 98.8 F (37.1 C)     Temp Source 07/27/20 1514 Oral  SpO2 07/27/20 1514 94 %     Weight --      Height --      Head Circumference --      Peak Flow --      Pain Score 07/27/20 1512 5     Pain Loc --      Pain Edu? --      Excl. in GC? --    No data found.  Updated Vital Signs BP (!) 165/88 (BP Location: Left Arm)   Pulse 84   Temp 98.8 F (37.1 C) (Oral)   Resp 16   SpO2 94% Comment: smoker  Visual Acuity Right Eye Distance:   Left Eye Distance:   Bilateral Distance:    Right Eye Near:   Left Eye Near:    Bilateral Near:     Physical Exam Vitals reviewed.  Constitutional:      General: He is awake.     Appearance: Normal appearance. He is normal weight. He is not ill-appearing.     Comments: Very pleasant male appears stated age in no acute distress sitting comfortably in  exam room  HENT:     Head: Normocephalic and atraumatic.  Cardiovascular:     Rate and Rhythm: Normal rate and regular rhythm.     Heart sounds: Normal heart sounds, S1 normal and S2 normal. No murmur heard. Pulmonary:     Effort: Pulmonary effort is normal.     Breath sounds: Normal breath sounds. No stridor. No wheezing, rhonchi or rales.     Comments: Clear to auscultation bilaterally Skin:    Findings: Abscess present.          Comments: 3 cm x 2 cm abscess with central fluctuance noted left anterior chest wall.  Surrounding erythema without streaking or evidence of lymphangitis.  No active bleeding or drainage noted.  Neurological:     Mental Status: He is alert.  Psychiatric:        Behavior: Behavior is cooperative.     UC Treatments / Results  Labs (all labs ordered are listed, but only abnormal results are displayed) Labs Reviewed - No data to display  EKG   Radiology No results found.  Procedures Incision and Drainage  Date/Time: 07/27/2020 3:45 PM Performed by: Jeani Hawking, PA-C Authorized by: Jeani Hawking, PA-C   Consent:    Consent obtained:  Verbal   Consent given by:  Patient   Risks, benefits, and alternatives were discussed: yes     Risks discussed:  Bleeding, incomplete drainage, pain and infection   Alternatives discussed:  No treatment, alternative treatment, observation and referral Universal protocol:    Procedure explained and questions answered to patient or proxy's satisfaction: yes     Patient identity confirmed:  Verbally with patient Location:    Type:  Abscess   Size:  3cm x 2 cm   Location:  Trunk   Trunk location:  Chest Pre-procedure details:    Skin preparation:  Chlorhexidine Sedation:    Sedation type:  None Anesthesia:    Anesthesia method:  Local infiltration   Local anesthetic:  Lidocaine 2% w/o epi Procedure type:    Complexity:  Simple Procedure details:    Ultrasound guidance: no     Needle aspiration: no      Incision types:  Single straight   Incision depth:  Dermal   Wound management:  Probed and deloculated and irrigated with saline   Drainage:  Bloody and purulent   Drainage amount:  Moderate   Wound treatment:  Wound left open   Packing materials:  None Post-procedure details:    Procedure completion:  Tolerated well, no immediate complications (including critical care time)  Medications Ordered in UC Medications  Tdap (BOOSTRIX) injection 0.5 mL (has no administration in time range)    Initial Impression / Assessment and Plan / UC Course  I have reviewed the triage vital signs and the nursing notes.  Pertinent labs & imaging results that were available during my care of the patient were reviewed by me and considered in my medical decision making (see chart for details).      Abscess was drained in clinic (see procedure note above).  Tetanus updated today.  Patient was started on doxycycline with instruction to avoid prolonged sun exposure due to sensitivity associated this medication.  He can use Tylenol ibuprofen for pain relief.  Encouraged him to keep area clean and use Bactroban to help with dressing changes.  Discussed alarm symptoms that warrant emergent evaluation.  Strict return precautions given to which patient expressed understanding.  Final Clinical Impressions(s) / UC Diagnoses   Final diagnoses:  Cutaneous abscess of chest wall     Discharge Instructions      Keep area clean.  Apply Bactroban daily.  Start doxycycline 100 mg twice daily for 10 days.  This can make you sensitive to the sun so stay out of the sun while on it.  You can use Tylenol and ibuprofen for pain relief.  If you have any worsening symptoms including increased pain, recurrent swelling, fevers, streaking from wound, nausea, vomiting you need to be reevaluated.     ED Prescriptions     Medication Sig Dispense Auth. Provider   doxycycline (VIBRAMYCIN) 100 MG capsule Take 1 capsule (100 mg  total) by mouth 2 (two) times daily. 20 capsule Seraj Dunnam K, PA-C   mupirocin ointment (BACTROBAN) 2 % Apply 1 application topically daily. 22 g Bonita Brindisi K, PA-C      PDMP not reviewed this encounter.   Jeani Hawking, PA-C 07/27/20 1547

## 2020-12-24 ENCOUNTER — Encounter: Payer: Self-pay | Admitting: *Deleted

## 2020-12-24 NOTE — Congregational Nurse Program (Signed)
  Dept: [phone]   Congregational Nurse Program Note  Date of Encounter: 12/24/2020  Past Medical History: Past Medical History:  Diagnosis Date   Hypertension     Encounter Details:  CNP Questionnaire - 12/24/20 1258       Questionnaire   Do you give verbal consent to treat you today? Yes    Location Patient Served  [facility]    Visit Setting Phone/Text/Email    Patient Status Homeless    Insurance Uninsured ([employer]/Self-Pay)    Insurance Referral N/A    Medication N/A    Medical Provider No    Screening Referrals N/A    Medical Referral Drug/Alcohol Services    Medical Appointment Made N/A    Food N/A    Transportation N/A    Housing/Utilities No permanent housing    Interpersonal Safety N/A    Intervention Support    ED Visit Averted N/A    Life-Saving Intervention Made N/A            Client called nurse's office inquiring about treatment for drugs/alcohol. 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 Talked with client about [facility] and [facility]. Gave contact information. Kristyna Bradstreet W RN CN

## 2021-10-13 ENCOUNTER — Ambulatory Visit (HOSPITAL_COMMUNITY)
Admission: EM | Admit: 2021-10-13 | Discharge: 2021-10-13 | Disposition: A | Discharge status: Home / Self Care | Payer: Commercial Managed Care - HMO | Attending: Internal Medicine | Admitting: Internal Medicine

## 2021-10-13 ENCOUNTER — Encounter (HOSPITAL_COMMUNITY): Payer: Self-pay | Admitting: *Deleted

## 2021-10-13 DIAGNOSIS — H6123 Impacted cerumen, bilateral: Secondary | ICD-10-CM | POA: Diagnosis present

## 2021-10-13 DIAGNOSIS — J029 Acute pharyngitis, unspecified: Secondary | ICD-10-CM | POA: Insufficient documentation

## 2021-10-13 LAB — POCT RAPID STREP A, ED / UC: Streptococcus, Group A Screen (Direct): NEGATIVE

## 2021-10-13 MED ORDER — IBUPROFEN 800 MG PO TABS
800.0000 mg | ORAL_TABLET | Freq: Once | ORAL | Status: AC
  Administered 2021-10-13: 800 mg via ORAL

## 2021-10-13 MED ORDER — IBUPROFEN 800 MG PO TABS
ORAL_TABLET | ORAL | Status: AC
  Filled 2021-10-13: qty 1

## 2021-10-13 MED ORDER — IBUPROFEN 600 MG PO TABS: 600.0000 mg | ORAL_TABLET | Freq: Four times a day (QID) | ORAL | 0 refills | Status: DC | PRN

## 2021-10-13 NOTE — ED Provider Notes (Signed)
[place]    CSN: [id number] Arrival date & time: 10/13/21  1010      History   Chief Complaint Chief Complaint  Patient presents with   Sore Throat    HPI Matthew Bradley is a 61 y.o. male.   Presents urgent care for evaluation of facial swelling, sore throat, ear pain, and fever/chills that started 1 week ago.  He is concerned that this may be residual facial swelling related to recent lisinopril allergic reaction with angioedema 1 week ago.  He was seen by his PCP for this and blood pressure medication was changed to amlodipine.  He is also concerned that he may be having an allergic reaction to the amlodipine due to onset of sore throat after beginning this medication.  He denies cough, shortness of breath, chest pain, nasal congestion, neck pain, eye drainage, dental pain, headache, dizziness, and blurry vision.  No nausea or vomiting.  Denies decreased hearing, tinnitus, and ear drainage.  Throat pain is currently a 9 on a scale of 0-10 and worse with swallowing.  He has been taking Tylenol for this at home which helps to take the edge off of the pain but does not help very much.  He has no attempted use of any NSAIDs over-the-counter prior to arrival urgent care or any other medications for symptoms.  States that the swelling to his face has reduced significantly since stopping lisinopril and he does not have any difficulty eating, drinking, or breathing.  No known sick contacts.     Sore Throat    Past Medical History:  Diagnosis Date   Hypertension     There are no problems to display for this patient.   History reviewed. No pertinent surgical history.     Home Medications    Prior to Admission medications   Medication Sig Start Date End Date Taking? Authorizing Provider  amLODipine (NORVASC) 5 MG tablet Take 5 mg by mouth daily. 10/03/21  Yes [provider]  ibuprofen (ADVIL) 600 MG tablet Take 1 tablet (600 mg total) by mouth every 6 (six)  hours as needed. 10/13/21  Yes Talbot Grumbling, FNP  albuterol (VENTOLIN HFA) 108 (90 Base) MCG/ACT inhaler Inhale 2 puffs into the lungs every 4 (four) hours as needed for wheezing or shortness of breath (or coughing). 04/15/19   Robyn Haber, MD  diclofenac sodium (VOLTAREN) 1 % GEL Apply 4 g topically 4 (four) times daily. 04/23/18   Scot Jun, FNP  doxycycline (VIBRAMYCIN) 100 MG capsule Take 1 capsule (100 mg total) by mouth 2 (two) times daily. 07/27/20   Raspet, Derry Skill, PA-C  mupirocin ointment (BACTROBAN) 2 % Apply 1 application topically daily. 07/27/20   Raspet, Derry Skill, PA-C  predniSONE (STERAPRED UNI-PAK 21 TAB) 10 MG (21) TBPK tablet 6 tabs for 1 day, then 5 tabs for 1 das, then 4 tabs for 1 day, then 3 tabs for 1 day, 2 tabs for 1 day, then 1 tab for 1 day 10/30/19   Wieters, Elesa Hacker, PA-C    Family History Family History  Problem Relation Age of Onset   Diabetes Mother    Heart failure Father     Social History Social History   Tobacco Use   Smoking status: Every Day    Packs/day: 0.30    Types: Cigarettes   Smokeless tobacco: Never  Vaping Use   Vaping Use: Never used  Substance Use Topics   Alcohol use: Yes    Comment: occasionally  Drug use: No     Allergies   Lisinopril, Other, and Latex   Review of Systems Review of Systems Per HPI  Physical Exam Triage Vital Signs ED Triage Vitals  Enc Vitals Group     BP 10/13/21 1136 137/89     Pulse Rate 10/13/21 1136 72     Resp 10/13/21 1136 18     Temp 10/13/21 1136 98.6 F (37 C)     Temp src --      SpO2 10/13/21 1136 98 %     Weight --      Height --      Head Circumference --      Peak Flow --      Pain Score 10/13/21 1134 9     Pain Loc --      Pain Edu? --      Excl. in GC? --    No data found.  Updated Vital Signs BP 137/89 (BP Location: Right Arm)   Pulse 72   Temp 98.6 F (37 C)   Resp 18   SpO2 98%   Visual Acuity Right Eye Distance:   Left Eye Distance:    Bilateral Distance:    Right Eye Near:   Left Eye Near:    Bilateral Near:     Physical Exam Vitals and nursing note reviewed.  Constitutional:      Appearance: Normal appearance. He is not ill-appearing or toxic-appearing.  HENT:     Head: Normocephalic and atraumatic.     Right Ear: Hearing and external ear normal. There is impacted cerumen.     Left Ear: Hearing and external ear normal. There is impacted cerumen.     Nose: Nose normal. No congestion or rhinorrhea.     Right Turbinates: Swollen.     Left Turbinates: Swollen.     Mouth/Throat:     Lips: Pink.     Mouth: Mucous membranes are moist. No oral lesions or angioedema.     Pharynx: Uvula midline. Posterior oropharyngeal erythema present. No oropharyngeal exudate or uvula swelling.     Tonsils: No tonsillar exudate or tonsillar abscesses. 1+ on the right. 1+ on the left.     Comments: No obvious facial swelling to inspection.  There is erythema to the posterior oropharynx without evidence of postnasal drainage.  Eyes:     General: Lids are normal. Vision grossly intact. Gaze aligned appropriately.     Extraocular Movements: Extraocular movements intact.     Conjunctiva/sclera: Conjunctivae normal.  Cardiovascular:     Rate and Rhythm: Normal rate and regular rhythm.     Heart sounds: Normal heart sounds, S1 normal and S2 normal.  Pulmonary:     Effort: Pulmonary effort is normal. No respiratory distress.     Breath sounds: Normal breath sounds and air entry.  Musculoskeletal:     Cervical back: Neck supple.  Skin:    General: Skin is warm and dry.     Capillary Refill: Capillary refill takes less than 2 seconds.     Findings: No rash.  Neurological:     General: No focal deficit present.     Mental Status: He is alert and oriented to person, place, and time. Mental status is at baseline.     Cranial Nerves: No dysarthria or facial asymmetry.  Psychiatric:        Mood and Affect: Mood normal.        Speech:  Speech normal.  Behavior: Behavior normal.        Thought Content: Thought content normal.        Judgment: Judgment normal.      UC Treatments / Results  Labs (all labs ordered are listed, but only abnormal results are displayed) Labs Reviewed  CULTURE, GROUP A STREP [facility])  POCT RAPID STREP A, ED / UC    EKG   Radiology No results found.  Procedures Procedures (including critical care time)  Medications Ordered in UC Medications  ibuprofen (ADVIL) tablet 800 mg (800 mg Oral Given 10/13/21 1249)    Initial Impression / Assessment and Plan / UC Course  I have reviewed the triage vital signs and the nursing notes.  Pertinent labs & imaging results that were available during my care of the patient were reviewed by me and considered in my medical decision making (see chart for details).   1.  Viral pharyngitis Group A strep testing in the clinic is negative.  Throat culture is pending.  We will treat based on throat culture with antibiotics.  No indication for viral testing today as patient symptoms started 1 week ago when he is out of the window for antiviral treatment.  No documented fever at home and he is afebrile in clinic today without antipyretic medications in his system.  He is clinically well-appearing with hemodynamically stable vital signs.  We will manage this with ibuprofen every 6 hours as needed for throat pain and swelling.  He may also take 1000 mg of Tylenol every 6 hours as needed for throat pain as well.  Follow-up with PCP or urgent care in the next 2 or 3 days if symptoms fail to improve or worsen.  Patient expresses agreement with plan.   2.  Bilateral impacted cerumen Bilateral ears cleaned out today with ear lavage in clinic by nursing staff successfully.  Suspect ear pain is related to cerumen impactions.  Patient reports relief of symptoms after ear lavage.  Discussed physical exam and available lab work findings in clinic with patient.   Counseled patient regarding appropriate use of medications and potential side effects for all medications recommended or prescribed today. Discussed red flag signs and symptoms of worsening condition,when to call the PCP office, return to urgent care, and when to seek higher level of care in the emergency department. Patient verbalizes understanding and agreement with plan. All questions answered. Patient discharged in stable condition.    Final Clinical Impressions(s) / UC Diagnoses   Final diagnoses:  Bilateral impacted cerumen  Viral pharyngitis     Discharge Instructions      Your sore throat is likely viral.  Your strep testing was negative in the clinic today.  I have sent your throat culture to the lab to evaluate for bacterial growth and we will call you if an antibiotic is indicated in the next 24 to 48 hours based on results of the throat culture.  You may continue using ibuprofen 600 mg every 6 hours at home as needed for throat pain and inflammation.  You may gargle with warm salt water every 3 to 4 hours to help with pain to the back of your throat.  Eat soft foods over the next 12 to 24 hours and increase water intake to at least 64 ounces of water to stay well-hydrated.  We flushed out your ears in the clinic today to remove earwax.  Avoid placing anything into the ear canals to remove wa as this can further push wax into  the ear canal deeper and cause impaction.   If you develop any new or worsening symptoms or do not improve in the next 2 to 3 days, please return.  If your symptoms are severe, please go to the emergency room.  Follow-up with your primary care provider for further evaluation and management of your symptoms as well as ongoing wellness visits.  I hope you feel better!     ED Prescriptions     Medication Sig Dispense Auth. Provider   ibuprofen (ADVIL) 600 MG tablet Take 1 tablet (600 mg total) by mouth every 6 (six) hours as needed. 30 tablet Carlisle Beers, FNP      PDMP not reviewed this encounter.   Carlisle Beers, [place] 10/13/21 1401

## 2021-10-13 NOTE — ED Triage Notes (Signed)
Pt complains of sore throat and facial swelling x 1 week but has got worse. He is having some difficulty swallowing. He was having a reaction to his lisinopril last week which caused facial swelling so his pcp changed him to amlodipine.  He is worried he is now having a reaction to that med. He is have no sob and is not in distress in triage.

## 2021-10-13 NOTE — Discharge Instructions (Addendum)
Your sore throat is likely viral.  Your strep testing was negative in the clinic today.  I have sent your throat culture to the lab to evaluate for bacterial growth and we will call you if an antibiotic is indicated in the next 24 to 48 hours based on results of the throat culture.  You may continue using ibuprofen 600 mg every 6 hours at home as needed for throat pain and inflammation.  You may gargle with warm salt water every 3 to 4 hours to help with pain to the back of your throat.  Eat soft foods over the next 12 to 24 hours and increase water intake to at least 64 ounces of water to stay well-hydrated.  We flushed out your ears in the clinic today to remove earwax.  Avoid placing anything into the ear canals to remove wa as this can further push wax into the ear canal deeper and cause impaction.   If you develop any new or worsening symptoms or do not improve in the next 2 to 3 days, please return.  If your symptoms are severe, please go to the emergency room.  Follow-up with your primary care provider for further evaluation and management of your symptoms as well as ongoing wellness visits.  I hope you feel better!

## 2021-10-15 LAB — CULTURE, GROUP A STREP (THRC)

## 2021-12-16 IMAGING — DX DG ANKLE COMPLETE 3+V*R*
3 series · 3 of 3 positions shown · non-contrast
Comparison: None.

CLINICAL DATA: Right ankle pain for 1 month since an injury when
the patient stepped off a curb. Swelling.

EXAM:
RIGHT ANKLE - COMPLETE 3+ VIEW

[ankle ap]
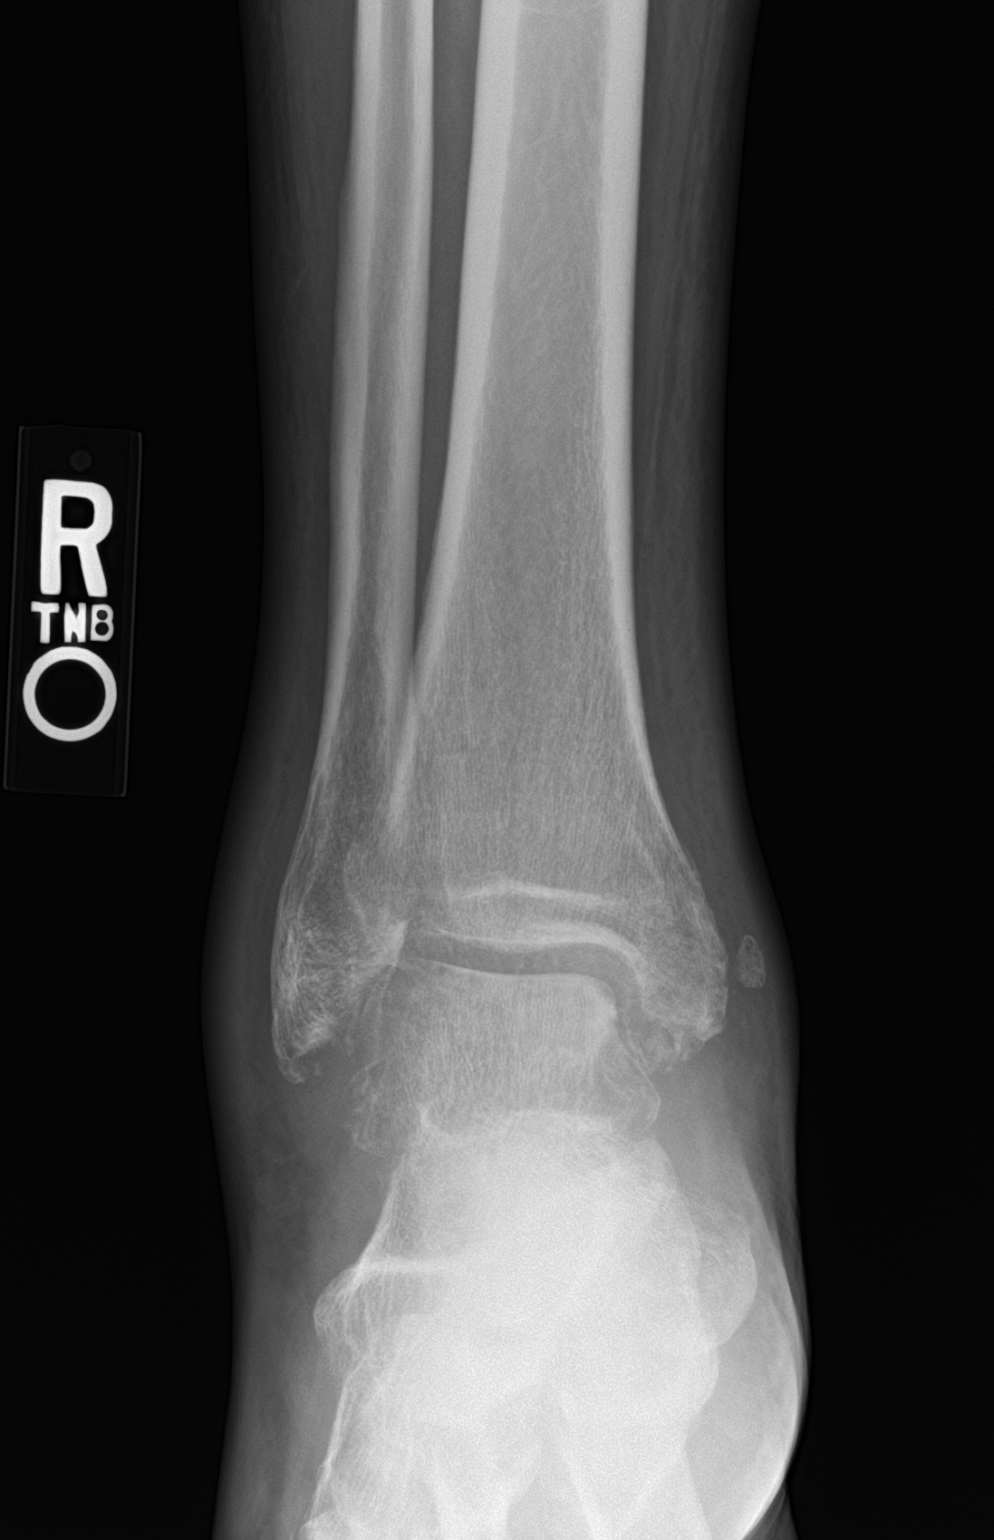

[ankle obl]
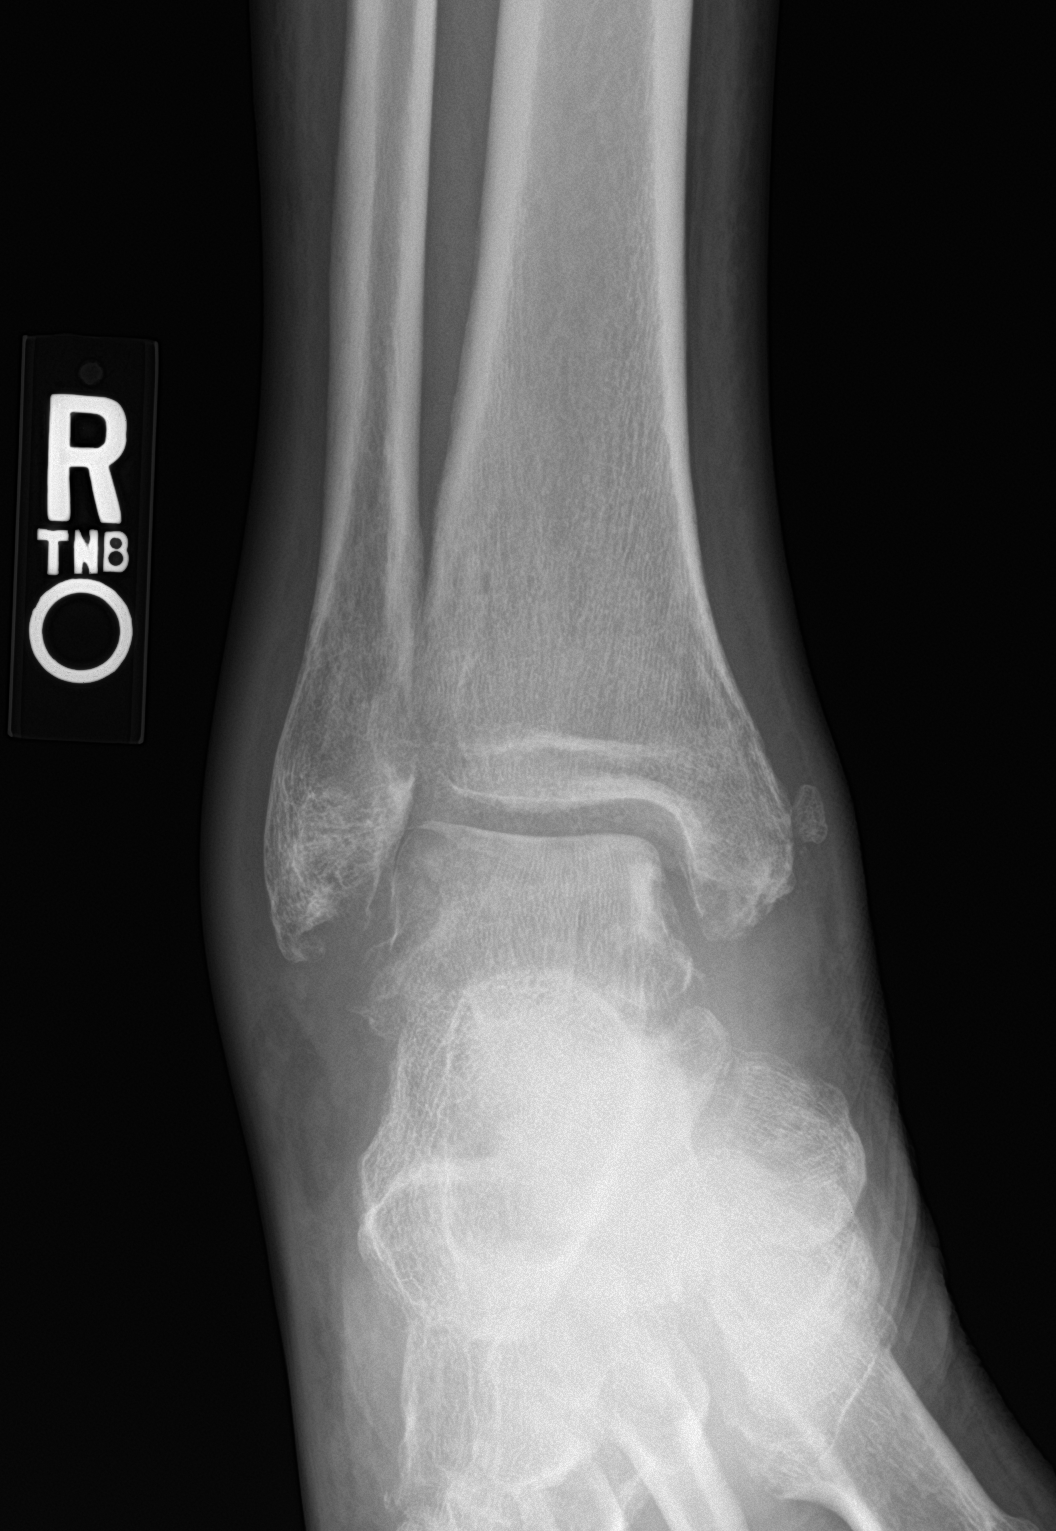

[ankle lat]
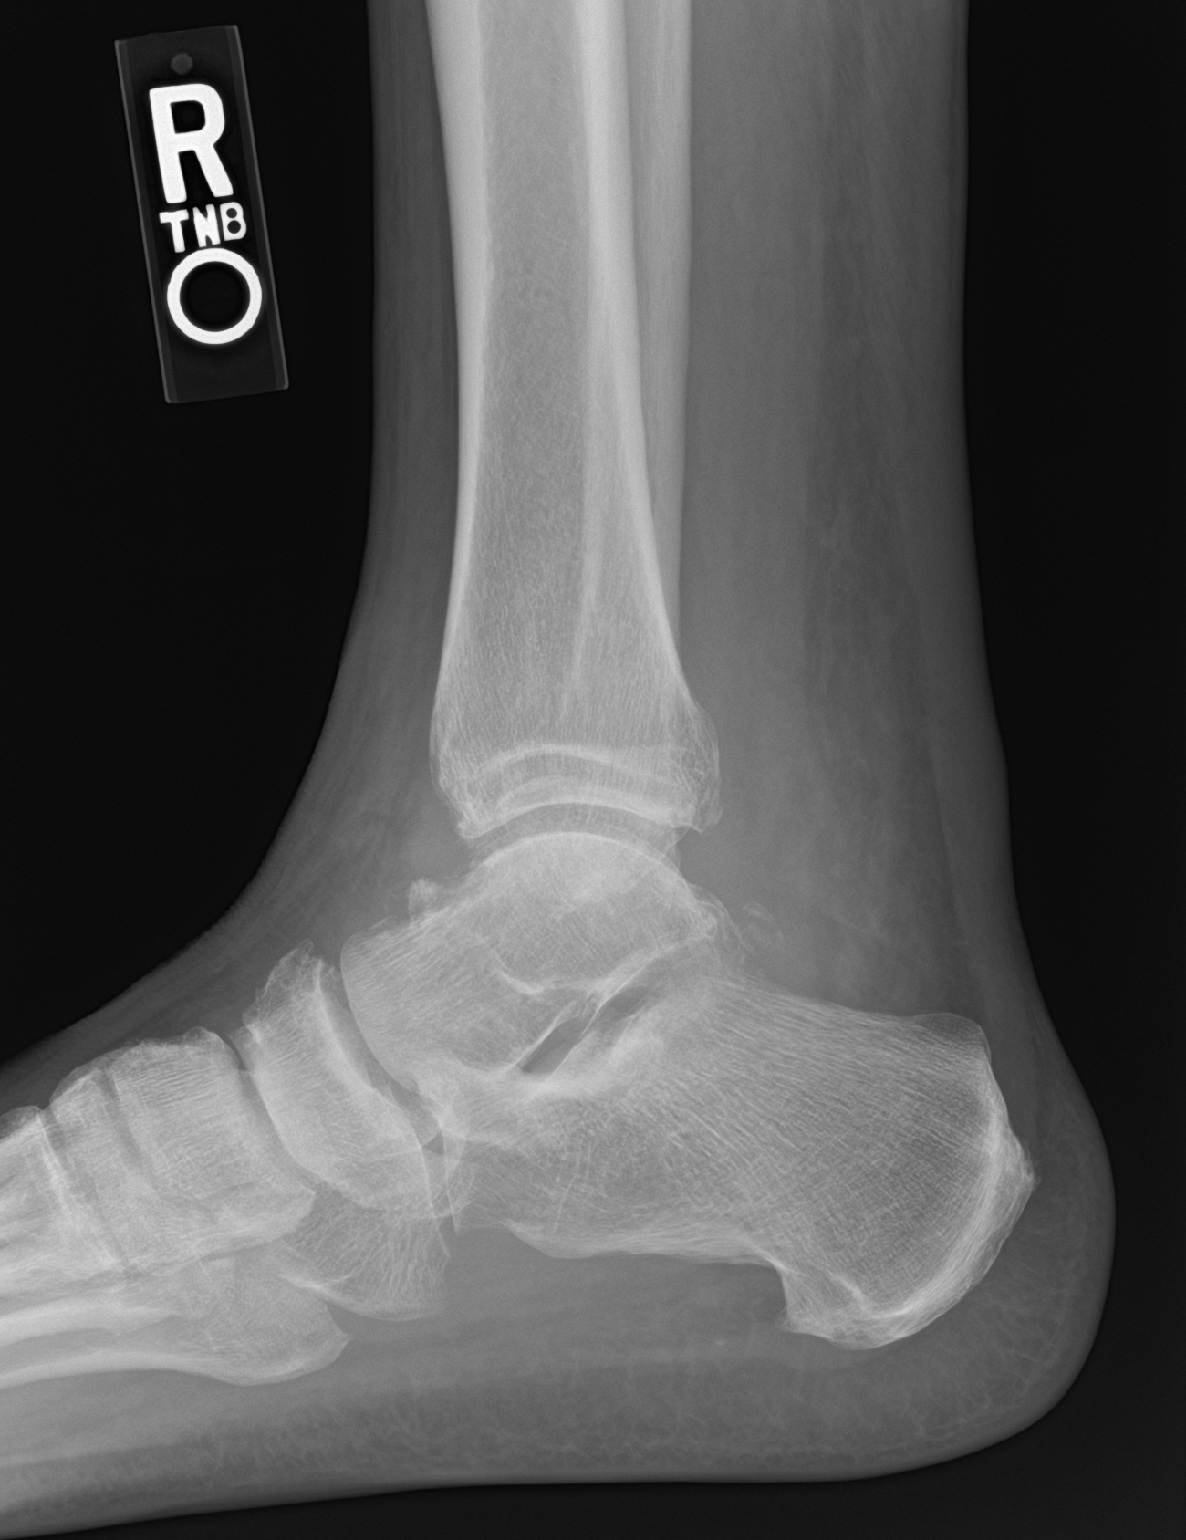

[3 of 3 positions shown; findings below may reference images not displayed]

FINDINGS: There is soft tissue swelling about the lateral aspect of the ankle.
Abnormal lucency is seen in the lateral margin of the talus adjacent
to the fibula and there is erosive change in the distal fibula.
Small well corticated fragment off the medial malleolus may be due
to old trauma. There is a tibiotalar joint effusion. No fracture.
IMPRESSION: Soft tissue swelling and loss of bone about the lateral aspect of
the ankle could be due to inflammatory arthropathy such as
rheumatoid or [HOSPITAL] arthropathy such as gout. Osteomyelitis is
also within the differential.

## 2021-12-18 ENCOUNTER — Encounter (HOSPITAL_COMMUNITY): Payer: Self-pay | Admitting: Emergency Medicine

## 2021-12-18 ENCOUNTER — Other Ambulatory Visit: Payer: Self-pay

## 2021-12-18 ENCOUNTER — Ambulatory Visit (HOSPITAL_COMMUNITY)
Admission: EM | Admit: 2021-12-18 | Discharge: 2021-12-18 | Disposition: A | Discharge status: Home / Self Care | Payer: Commercial Managed Care - HMO | Attending: Family Medicine | Admitting: Family Medicine

## 2021-12-18 DIAGNOSIS — L02213 Cutaneous abscess of chest wall: Secondary | ICD-10-CM

## 2021-12-18 DIAGNOSIS — L0291 Cutaneous abscess, unspecified: Secondary | ICD-10-CM

## 2021-12-18 MED ORDER — DOXYCYCLINE HYCLATE 100 MG PO CAPS: 100.0000 mg | ORAL_CAPSULE | Freq: Two times a day (BID) | ORAL | 0 refills | Status: DC

## 2021-12-18 MED ORDER — LIDOCAINE-EPINEPHRINE 1 %-1:100000 IJ SOLN
INTRAMUSCULAR | Status: AC
  Filled 2021-12-18: qty 1

## 2021-12-18 NOTE — ED Provider Notes (Signed)
  [facility]   [id number] 12/18/21 Arrival Time: 0910  ASSESSMENT & PLAN:  1. Abscess     Incision and Drainage Procedure Note  Anesthesia: 2% plain lidocaine  Procedure Details  The procedure, risks and complications have been discussed in detail (including, but not limited to pain and bleeding) with the patient.  The skin induration was prepped and draped in the usual fashion. After adequate local anesthesia, I&D with a #11 blade was performed on the left mid chest wall just lateral to midline with copious, bloody, purulent drainage.  EBL: minimal Drains: none Packing: 1/2" iodoform Condition: Tolerated procedure well Complications: none.  Meds ordered this encounter  Medications   doxycycline (VIBRAMYCIN) 100 MG capsule    Sig: Take 1 capsule (100 mg total) by mouth 2 (two) times daily.    Dispense:  20 capsule    Refill:  0   Prefers ibuprofen for pain control.  Wound care instructions discussed and given in written format. To return in 48 hours for wound check and packing removal, sooner if needed.[name] all antibiotics. OTC analgesics as needed.  Reviewed expectations re: course of current medical issues. Questions answered. Outlined signs and symptoms indicating need for more acute intervention. Patient verbalized understanding. After Visit Summary given.   SUBJECTIVE:  Matthew Bradley is a 61 y.o. male who presents with a possible infection of his chest wall; h/o cyst here. Recent enlargement/pain; without drainage or bleeding. Afebrile No tx PTA>   OBJECTIVE:  Vitals:   12/18/21 1041  BP: (!) 160/90  Pulse: 83  Resp: 18  Temp: 98.1 F (36.7 C)  TempSrc: Oral  SpO2: 96%     General appearance: alert; no distress Chest wall: approx 4x2 cm vertical induration of his left chest wall; tender to touch; no active drainage or bleeding Psychological: alert and cooperative; normal mood and affect  Allergies  Allergen Reactions    Lisinopril Swelling   Other     Some metals   Latex Rash    Past Medical History:  Diagnosis Date   Hypertension    Social History   Socioeconomic History   Marital status: Single    Spouse name: Not on file   Number of children: Not on file   Years of education: Not on file   Highest education level: Not on file  Occupational History   Not on file  Tobacco Use   Smoking status: Every Day    Packs/day: 0.30    Types: Cigarettes   Smokeless tobacco: Never  Vaping Use   Vaping Use: Never used  Substance and Sexual Activity   Alcohol use: Yes    Comment: occasionally   Drug use: No   Sexual activity: Not Currently  Other Topics Concern   Not on file  Social History Narrative   Not on file   Social Determinants of Health   Financial Resource Strain: Not on file  Food Insecurity: Not on file  Transportation Needs: Not on file  Physical Activity: Not on file  Stress: Not on file  Social Connections: Not on file   Family History  Problem Relation Age of Onset   Diabetes Mother    Heart failure Father    History reviewed. No pertinent surgical history.          Mardella Layman, MD 12/18/21 [phone]

## 2021-12-18 NOTE — ED Triage Notes (Signed)
Reports a history of cysts.  Reports he has one on epigastric area.  This site appeared 10 years ago.    This episode started around Friday or saturday

## 2022-01-22 ENCOUNTER — Ambulatory Visit (HOSPITAL_COMMUNITY)
Admission: RE | Admit: 2022-01-22 | Discharge: 2022-01-22 | Disposition: A | Discharge status: Home / Self Care | Payer: No Typology Code available for payment source | Source: Ambulatory Visit | Attending: Family Medicine | Admitting: Family Medicine

## 2022-01-22 ENCOUNTER — Encounter (HOSPITAL_COMMUNITY): Payer: Self-pay

## 2022-01-22 VITALS — BP 140/90 | HR 103 | Temp 99.2°F | Resp 16 | Ht 77.0 in | Wt 250.0 lb

## 2022-01-22 DIAGNOSIS — Z1152 Encounter for screening for COVID-19: Secondary | ICD-10-CM | POA: Diagnosis not present

## 2022-01-22 DIAGNOSIS — J4521 Mild intermittent asthma with (acute) exacerbation: Secondary | ICD-10-CM | POA: Diagnosis present

## 2022-01-22 DIAGNOSIS — R059 Cough, unspecified: Secondary | ICD-10-CM | POA: Insufficient documentation

## 2022-01-22 DIAGNOSIS — J069 Acute upper respiratory infection, unspecified: Secondary | ICD-10-CM | POA: Diagnosis present

## 2022-01-22 MED ORDER — PREDNISONE 20 MG PO TABS: 40.0000 mg | ORAL_TABLET | Freq: Every day | ORAL | 0 refills | Status: AC

## 2022-01-22 MED ORDER — BENZONATATE 100 MG PO CAPS: 100.0000 mg | ORAL_CAPSULE | Freq: Three times a day (TID) | ORAL | 0 refills | Status: DC | PRN

## 2022-01-22 MED ORDER — ALBUTEROL SULFATE HFA 108 (90 BASE) MCG/ACT IN AERS: 2.0000 | INHALATION_SPRAY | RESPIRATORY_TRACT | 0 refills | Status: AC | PRN

## 2022-01-22 NOTE — ED Triage Notes (Signed)
Chief Complaint: Cough, Mucus, Sweats, Body Aches, Light Sensitivity. Productive cough with clear mucus. Chest congestion and slight nasal congestion with drainage.   Onset: this past Saturday night   Prescriptions or OTC medications tried: Yes- ibuprofen    with mild relief  Sick exposure: Yes- neighbors are sick, unknown positive testing, possible RSV   New foods, medications, or products: No  Recent Travel: No

## 2022-01-22 NOTE — Discharge Instructions (Signed)
Albuterol inhaler--do 2 puffs every 4 hours as needed for shortness of breath or wheezing  Take prednisone 20 mg--2 daily for 5 days  Take benzonatate 100 mg, 1 tab every 8 hours as needed for cough.   You have been swabbed for COVID, and the test will result in the next 24 hours. Our staff will call you if positive. If the COVID test is positive, you should quarantine for 5 days from the start of your symptoms

## 2022-01-22 NOTE — ED Provider Notes (Signed)
[facility]    CSN: [id number] Arrival date & time: 01/22/22  2951      History   Chief Complaint Chief Complaint  Patient presents with   URI   Headache    HPI Matthew Bradley is a 62 y.o. male.    URI Associated symptoms: headaches   Headache Associated symptoms: URI    Here for cough and congestion and mild sore throat.  Symptoms began on the evening of January 6.  He has not noted any fever except for possibly low-grade in the 99 range.  He has had chills.  No nausea or vomiting or diarrhea.  He has not really noted any shortness of breath.  He has used an inhaler once before when he had pneumonia. He has had some malaise.  His neighbors that he has been exposed to had similar symptoms and have had some positive testing, possibly RSV.  Past Medical History:  Diagnosis Date   Hypertension     There are no problems to display for this patient.   Past Surgical History:  Procedure Laterality Date   CYST REMOVAL TRUNK Left        Home Medications    Prior to Admission medications   Medication Sig Start Date End Date Taking? Authorizing Provider  amLODipine (NORVASC) 5 MG tablet Take 5 mg by mouth daily. 10/03/21  Yes [provider]  benzonatate (TESSALON) 100 MG capsule Take 1 capsule (100 mg total) by mouth 3 (three) times daily as needed for cough. 01/22/22  Yes Zenia Resides, MD  diclofenac sodium (VOLTAREN) 1 % GEL Apply 4 g topically 4 (four) times daily. 04/23/18  Yes Bing Neighbors, FNP  predniSONE (DELTASONE) 20 MG tablet Take 2 tablets (40 mg total) by mouth daily with breakfast for 5 days. 01/22/22 01/27/22 Yes Sherry Blackard, Janace Aris, MD  albuterol (VENTOLIN HFA) 108 (90 Base) MCG/ACT inhaler Inhale 2 puffs into the lungs every 4 (four) hours as needed for wheezing or shortness of breath (or coughing). 01/22/22   Zenia Resides, MD    Family History Family History  Problem Relation Age of Onset   Diabetes Mother    Heart  failure Father     Social History Social History   Tobacco Use   Smoking status: Every Day    Packs/day: 0.30    Types: Cigarettes   Smokeless tobacco: Never  Vaping Use   Vaping Use: Never used  Substance Use Topics   Alcohol use: Yes    Comment: occasionally   Drug use: No     Allergies   Lisinopril, Other, and Latex   Review of Systems Review of Systems  Neurological:  Positive for headaches.     Physical Exam Triage Vital Signs ED Triage Vitals  Enc Vitals Group     BP 01/22/22 1012 (!) 140/90     Pulse Rate 01/22/22 1012 (!) 103     Resp 01/22/22 1012 16     Temp 01/22/22 1012 99.2 F (37.3 C)     Temp Source 01/22/22 1012 Oral     SpO2 01/22/22 1012 91 %     Weight 01/22/22 1012 250 lb (113.4 kg)     Height 01/22/22 1012 6\' 5"  (1.956 m)     Head Circumference --      Peak Flow --      Pain Score 01/22/22 1009 5     Pain Loc --      Pain Edu? --  Excl. in GC? --    No data found.  Updated Vital Signs BP (!) 140/90 (BP Location: Right Arm)   Pulse (!) 103   Temp 99.2 F (37.3 C) (Oral)   Resp 16   Ht 6\' 5"  (1.956 m)   Wt 113.4 kg   SpO2 91%   BMI 29.65 kg/m   Visual Acuity Right Eye Distance:   Left Eye Distance:   Bilateral Distance:    Right Eye Near:   Left Eye Near:    Bilateral Near:     Physical Exam Vitals reviewed.  Constitutional:      General: He is not in acute distress.    Appearance: He is not toxic-appearing.  HENT:     Right Ear: Tympanic membrane and ear canal normal.     Left Ear: Tympanic membrane and ear canal normal.     Nose: Nose normal.     Mouth/Throat:     Mouth: Mucous membranes are moist.     Pharynx: No oropharyngeal exudate or posterior oropharyngeal erythema.  Eyes:     Extraocular Movements: Extraocular movements intact.     Conjunctiva/sclera: Conjunctivae normal.     Pupils: Pupils are equal, round, and reactive to light.  Cardiovascular:     Rate and Rhythm: Normal rate and regular  rhythm.     Heart sounds: No murmur heard. Pulmonary:     Effort: No respiratory distress.     Breath sounds: No stridor. No rhonchi or rales.     Comments: There are some low pitched wheezes in the right upper lung field posteriorly Musculoskeletal:     Cervical back: Neck supple.  Lymphadenopathy:     Cervical: No cervical adenopathy.  Skin:    Capillary Refill: Capillary refill takes less than 2 seconds.     Coloration: Skin is not jaundiced or pale.  Neurological:     General: No focal deficit present.     Mental Status: He is alert and oriented to person, place, and time.  Psychiatric:        Behavior: Behavior normal.      UC Treatments / Results  Labs (all labs ordered are listed, but only abnormal results are displayed) Labs Reviewed  SARS CORONAVIRUS 2 (TAT 6-24 HRS)    EKG   Radiology No results found.  Procedures Procedures (including critical care time)  Medications Ordered in UC Medications - No data to display  Initial Impression / Assessment and Plan / UC Course  I have reviewed the triage vital signs and the nursing notes.  Pertinent labs & imaging results that were available during my care of the patient were reviewed by me and considered in my medical decision making (see chart for details).       eGFR in care everywhere in 2022 was 97.  I discussed with him that we are out of the RSV PCR test kits.  Though he is 74 and does most likely have mild intermittent asthma, there would be no specific treatment for the RSV at this point.  He is swabbed for COVID and if positive he would be a candidate for Paxlovid.  Tomorrow is day 5 of symptoms.  Albuterol, prednisone, and Tessalon Perles are sent in for symptoms and for asthma exacerbation. Final Clinical Impressions(s) / UC Diagnoses   Final diagnoses:  Viral URI with cough  Mild intermittent asthma with acute exacerbation     Discharge Instructions      Albuterol inhaler--do 2 puffs  every 4  hours as needed for shortness of breath or wheezing  Take prednisone 20 mg--2 daily for 5 days  Take benzonatate 100 mg, 1 tab every 8 hours as needed for cough.   You have been swabbed for COVID, and the test will result in the next 24 hours. Our staff will call you if positive. If the COVID test is positive, you should quarantine for 5 days from the start of your symptoms      ED Prescriptions     Medication Sig Dispense Auth. Provider   albuterol (VENTOLIN HFA) 108 (90 Base) MCG/ACT inhaler Inhale 2 puffs into the lungs every 4 (four) hours as needed for wheezing or shortness of breath (or coughing). 18 g Barrett Henle, MD   predniSONE (DELTASONE) 20 MG tablet Take 2 tablets (40 mg total) by mouth daily with breakfast for 5 days. 10 tablet Barrett Henle, MD   benzonatate (TESSALON) 100 MG capsule Take 1 capsule (100 mg total) by mouth 3 (three) times daily as needed for cough. 21 capsule Barrett Henle, MD      PDMP not reviewed this encounter.   Barrett Henle, MD 01/22/22 [phone]

## 2022-01-23 LAB — SARS CORONAVIRUS 2 (TAT 6-24 HRS): SARS Coronavirus 2: NEGATIVE

## 2022-03-07 ENCOUNTER — Ambulatory Visit: Payer: Medicaid Other | Admitting: Student

## 2022-03-07 ENCOUNTER — Encounter: Payer: Self-pay | Admitting: Student

## 2022-03-07 VITALS — BP 145/95 | HR 102 | Temp 98.5°F | Ht 77.0 in | Wt 267.7 lb

## 2022-03-07 DIAGNOSIS — G8929 Other chronic pain: Secondary | ICD-10-CM | POA: Diagnosis not present

## 2022-03-07 DIAGNOSIS — I10 Essential (primary) hypertension: Secondary | ICD-10-CM | POA: Diagnosis not present

## 2022-03-07 DIAGNOSIS — M79671 Pain in right foot: Secondary | ICD-10-CM

## 2022-03-07 DIAGNOSIS — F17210 Nicotine dependence, cigarettes, uncomplicated: Secondary | ICD-10-CM

## 2022-03-07 DIAGNOSIS — Z1159 Encounter for screening for other viral diseases: Secondary | ICD-10-CM | POA: Insufficient documentation

## 2022-03-07 DIAGNOSIS — E785 Hyperlipidemia, unspecified: Secondary | ICD-10-CM | POA: Insufficient documentation

## 2022-03-07 DIAGNOSIS — Z114 Encounter for screening for human immunodeficiency virus [HIV]: Secondary | ICD-10-CM | POA: Insufficient documentation

## 2022-03-07 DIAGNOSIS — Z1211 Encounter for screening for malignant neoplasm of colon: Secondary | ICD-10-CM | POA: Insufficient documentation

## 2022-03-07 MED ORDER — AMLODIPINE BESYLATE 10 MG PO TABS: 10.0000 mg | ORAL_TABLET | Freq: Every day | ORAL | 1 refills | Status: DC

## 2022-03-07 NOTE — Assessment & Plan Note (Signed)
Hepatitis C screening ordered  Addendum: -Screening negative

## 2022-03-07 NOTE — Assessment & Plan Note (Addendum)
Referred patient to colonoscopy for colon cancer screening

## 2022-03-07 NOTE — Assessment & Plan Note (Signed)
Patient has a past medical history of chronic right foot pain and stiffness.  He states that this pain and stiffness started about 2-1/2 years ago when he fell off a curb.  He states at that time, he was evaluated, and had imaging, and at that time patient had been told he may have had gout.  States pain comes and goes and describes it as a stiffness.  He states he gets worse with eating sweet foods and drinking alcohol.  Patient reports that nothing helps the pain, and he comes and goes daily.  On exam, patient is swelling noted, tenderness, decreased range of motion, or any other concerns on exam.  Per charting, imaging does show changes consistent with inflammatory concern such as gout or rheumatoid arthritis.  There is loss of bone about the lateral aspect of the ankle could be due to inflammatory arthropathy such as rheumatoid or crystal arthropathy such as gout that is noticed on right ankle x-ray in 2021.  Given that this is bilateral, less concerned about respiratory, but something to keep in my differential.  Osteomyelitis less likely as well.  Plan: -MRI right foot pending -Uric acid level pending -Patient could benefit from uric acid lowering therapy  Addendum: -Uric acid levels within normal limits but slightly on higher end.  -Will need to wait for MRI

## 2022-03-07 NOTE — Progress Notes (Signed)
CC: Hypertension  HPI:  [name] is a 62 y.o. male with a past medical history of hypertension, left nostril follow-up, and possible gout presenting with to establish care and evaluate for hypertension and right foot pain.  Please see assessment plan below for full HPI.  History: Medical: Hypertension, left nostril. Meds: Albuterol, amlodipine 10 mg daily, Voltaren gel Surgical: N/A Family: Mom: Stroke and diabetes mellitus, Father: CHF Tobacco: 20 years smoking history 1 pack/day Alcohol: 4 beers daily Drug use: N/A Lives with: Alone Occupation: Art therapist Support: Great support and makes his own decisions   Past Medical History:  Diagnosis Date   Hypertension      Current Outpatient Medications:    albuterol (VENTOLIN HFA) 108 (90 Base) MCG/ACT inhaler, Inhale 2 puffs into the lungs every 4 (four) hours as needed for wheezing or shortness of breath (or coughing)., Disp: 18 g, Rfl: 0   amLODipine (NORVASC) 10 MG tablet, Take 1 tablet (10 mg total) by mouth daily., Disp: 90 tablet, Rfl: 1   diclofenac sodium (VOLTAREN) 1 % GEL, Apply 4 g topically 4 (four) times daily., Disp: 100 g, Rfl: 0  Review of Systems:    MSK: Patient endorses chronic right foot pain   Physical Exam:  Vitals:   03/07/22 0953 03/07/22 1001  BP: (!) 152/98 (!) 145/95  Pulse: (!) 102 (!) 102  Temp: 98.5 F (36.9 C)   TempSrc: Oral   SpO2: 95%   Weight: 267 lb 11.2 oz (121.4 kg)   Height: '6\' 5"'$  (1.956 m)     General: Patient is sitting comfortably in the room  Head: Normocephalic, atraumatic  Cardio: Regular rate and rhythm, no murmurs, rubs or gallops. 2+ pulses to bilateral upper and lower extremities  Chest: No chest tenderness Pulmonary: Clear to ausculation bilaterally with no rales, rhonchi, and crackles  MSK: 5/5 strength to upper and lower extremities.    Assessment & Plan:   Primary hypertension Patient has a past medical history of hypertension.  Patient  presents with elevated into the 140s over 90s.  Patient denies any headaches, chest pain, shortness of breath.  Patient denies any vision changes.  Patient is a daily smoker.  He reports compliance with his amlodipine 5 mg daily.  Given that blood pressure is not well-controlled, increase amlodipine to 10 mg daily  Plan: -Increase amlodipine to 10 mg daily -BMP pending -Follow-up in 1 month with blood pressure log  Addendum: -Kidney function within normal limits.    Chronic foot pain, right Patient has a past medical history of chronic right foot pain and stiffness.  He states that this pain and stiffness started about 2-1/2 years ago when he fell off a curb.  He states at that time, he was evaluated, and had imaging, and at that time patient had been told he may have had gout.  States pain comes and goes and describes it as a stiffness.  He states he gets worse with eating sweet foods and drinking alcohol.  Patient reports that nothing helps the pain, and he comes and goes daily.  On exam, patient is swelling noted, tenderness, decreased range of motion, or any other concerns on exam.  Per charting, imaging does show changes consistent with inflammatory concern such as gout or rheumatoid arthritis.  There is loss of bone about the lateral aspect of the ankle could be due to inflammatory arthropathy such as rheumatoid or crystal arthropathy such as gout that is noticed on right ankle x-ray in 2021.  Given that this is bilateral, less concerned about respiratory, but something to keep in my differential.  Osteomyelitis less likely as well.  Plan: -MRI right foot pending -Uric acid level pending -Patient could benefit from uric acid lowering therapy  Addendum: -Uric acid levels within normal limits but slightly on higher end.  -Will need to wait for MRI   Colon cancer screening Referred patient to colonoscopy for colon cancer screening  Encounter for screening for HIV HIV screening  ordered  Addendum: -Negative   Hyperlipidemia Patient has a history of in September 2022 showing total cholesterol 229, triglycerides 64, LDL 31, HDL 87.  ASCVD score 23.4%.  Had a conversation with patient about potentially starting statin, patient states he would not like to start a statin at this moment.  I state that he has a very high risk of stroke and MI  in next 10 years, and patient understands this. He would like to proceed with lifestyle modification. I do give patient handout about the importance of keeping cholesterol low.   Plan: -Lifestyle modifications -Handout provided for importance of keeping cholesterol -Lipid panel pending   Addendum: -New lipid panel elevated. The 10-year ASCVD risk score (Arnett DK, et al., 2019) is: 23.8%   Values used to calculate the score:     Age: 19 years     Sex: Male     Is Non-Hispanic African American: Yes     Diabetic: No     Tobacco smoker: Yes     Systolic Blood Pressure: Q000111Q mmHg     Is BP treated: Yes     HDL Cholesterol: 90 mg/dL     Total Cholesterol: 220 mg/dL -Recommend starting statin, will have another conversation with patient about starting a statin.    Need for hepatitis C screening test Hepatitis C screening ordered  Addendum: -Screening negative    Patient discussed with Dr. Wray Kearns, DO PGY-1 Internal Medicine Resident  Pager: [phone]

## 2022-03-07 NOTE — Assessment & Plan Note (Signed)
Patient has a past medical history of hypertension.  Patient presents with elevated into the 140s over 90s.  Patient denies any headaches, chest pain, shortness of breath.  Patient denies any vision changes.  Patient is a daily smoker.  He reports compliance with his amlodipine 5 mg daily.  Given that blood pressure is not well-controlled, increase amlodipine to 10 mg daily  Plan: -Increase amlodipine to 10 mg daily -BMP pending -Follow-up in 1 month with blood pressure log  Addendum: -Kidney function within normal limits.

## 2022-03-07 NOTE — Patient Instructions (Addendum)
Izsak, Hartunian you for allowing me to take part in your care today.  Here are your instructions.  1.  Regarding your right foot pain, obtaining an MRI of your foot, please wait for phone call for scheduling.  I am also going to check your uric acid levels.  Please await phone call with the results.  2.  Regarding your hypertension, I have increased amlodipine to 10 mg daily.  Please pick this up from your pharmacy.  Please keep a log of your blood pressure, and please come back  with a log filled out.  3.  Please look into starting a statin, as you are at high risk for heart attack in the next 10 years.  You can look into statin therapy online.  I will be checking your lipid levels and kidney function today.  Please await phone call for results.  4.  I am also checking HIV and hepatitis C screening.  Please await phone call for results.  5.  I have referred you for colonoscopy.  Please await phone call.  6.  Please follow-up in 1 month for hypertension follow-up   Thank you, Dr. Posey Pronto  If you have any other questions please contact the internal medicine clinic at [phone]

## 2022-03-07 NOTE — Addendum Note (Signed)
Addended by: Leigh Aurora on: 03/07/2022 01:31 PM   Modules accepted: Orders

## 2022-03-07 NOTE — Assessment & Plan Note (Signed)
Patient has a history of in September 2022 showing total cholesterol 229, triglycerides 64, LDL 31, HDL 87.  ASCVD score 23.4%.  Had a conversation with patient about potentially starting statin, patient states he would not like to start a statin at this moment.  I state that he has a very high risk of stroke and MI  in next 10 years, and patient understands this. He would like to proceed with lifestyle modification. I do give patient handout about the importance of keeping cholesterol low.   Plan: -Lifestyle modifications -Handout provided for importance of keeping cholesterol -Lipid panel pending   Addendum: -New lipid panel elevated. The 10-year ASCVD risk score (Arnett DK, et al., 2019) is: 23.8%   Values used to calculate the score:     Age: 54 years     Sex: Male     Is Non-Hispanic African American: Yes     Diabetic: No     Tobacco smoker: Yes     Systolic Blood Pressure: Q000111Q mmHg     Is BP treated: Yes     HDL Cholesterol: 90 mg/dL     Total Cholesterol: 220 mg/dL -Recommend starting statin, will have another conversation with patient about starting a statin.

## 2022-03-07 NOTE — Addendum Note (Signed)
Addended by: Leigh Aurora on: 03/07/2022 01:33 PM   Modules accepted: Orders

## 2022-03-07 NOTE — Assessment & Plan Note (Signed)
HIV screening ordered  Addendum: -Negative

## 2022-03-08 LAB — LIPID PANEL
Chol/HDL Ratio: 2.4 ratio (ref 0.0–5.0)
Cholesterol, Total: 220 mg/dL — ABNORMAL HIGH (ref 100–199)
HDL: 90 mg/dL (ref >39)
LDL Chol Calc (NIH): 108 mg/dL — ABNORMAL HIGH (ref 0–99)
Triglycerides: 131 mg/dL (ref 0–149)
VLDL Cholesterol Cal: 22 mg/dL (ref 5–40)

## 2022-03-08 LAB — BMP8+ANION GAP
Anion Gap: 18 mmol/L (ref 10.0–18.0)
BUN/Creatinine Ratio: 12 (ref 10–24)
BUN: 10 mg/dL (ref 8–27)
CO2: 21 mmol/L (ref 20–29)
Calcium: 10.1 mg/dL (ref 8.6–10.2)
Chloride: 104 mmol/L (ref 96–106)
Creatinine, Ser: 0.83 mg/dL (ref 0.76–1.27)
Glucose: 91 mg/dL (ref 70–99)
Potassium: 4.3 mmol/L (ref 3.5–5.2)
Sodium: 143 mmol/L (ref 134–144)
eGFR: 100 mL/min/{1.73_m2} (ref >59)

## 2022-03-08 LAB — HIV ANTIBODY (ROUTINE TESTING W REFLEX): HIV Screen 4th Generation wRfx: NONREACTIVE

## 2022-03-08 LAB — HCV AB W REFLEX TO QUANT PCR: HCV Ab: NONREACTIVE

## 2022-03-08 LAB — HCV INTERPRETATION

## 2022-03-08 LAB — URIC ACID: Uric Acid: 6.8 mg/dL (ref 3.8–8.4)

## 2022-03-10 NOTE — Progress Notes (Addendum)
Internal Medicine Clinic Attending  Case discussed with Dr. Patel  At the time of the visit.  We reviewed the resident's history and exam and pertinent patient test results.  I agree with the assessment, diagnosis, and plan of care documented in the resident's note.  

## 2022-03-21 ENCOUNTER — Ambulatory Visit (HOSPITAL_COMMUNITY): Payer: Medicaid Other

## 2022-03-28 ENCOUNTER — Ambulatory Visit (HOSPITAL_COMMUNITY)
Admission: RE | Admit: 2022-03-28 | Discharge: 2022-03-28 | Disposition: A | Discharge status: Home / Self Care | Payer: Medicaid Other | Source: Ambulatory Visit | Attending: Internal Medicine | Admitting: Internal Medicine

## 2022-03-28 DIAGNOSIS — M79671 Pain in right foot: Secondary | ICD-10-CM | POA: Diagnosis present

## 2022-03-28 DIAGNOSIS — G8929 Other chronic pain: Secondary | ICD-10-CM | POA: Diagnosis present

## 2022-03-28 MED ORDER — GADOBUTROL 1 MMOL/ML IV SOLN
7.5000 mL | Freq: Once | INTRAVENOUS | Status: AC | PRN
  Administered 2022-03-28: 7.5 mL via INTRAVENOUS

## 2022-04-04 ENCOUNTER — Encounter: Payer: Medicaid Other | Admitting: Student

## 2022-09-09 ENCOUNTER — Encounter: Payer: Self-pay | Admitting: Student

## 2022-09-26 ENCOUNTER — Encounter: Payer: Self-pay | Admitting: Pharmacist

## 2022-10-01 ENCOUNTER — Encounter: Payer: Medicaid Other | Admitting: Student

## 2022-10-01 NOTE — Progress Notes (Deleted)
   CC: ***  HPI:  [name] is a 62 y.o. male with past medical history of hypertension.  Hypertension: Amlodipine 10 mg daily Last seen in February 2024: 1 month blood pressure log.  Patient to follow-up.   Past Medical History:  Diagnosis Date   Hypertension      Current Outpatient Medications:    albuterol (VENTOLIN HFA) 108 (90 Base) MCG/ACT inhaler, Inhale 2 puffs into the lungs every 4 (four) hours as needed for wheezing or shortness of breath (or coughing)., Disp: 18 g, Rfl: 0   amLODipine (NORVASC) 10 MG tablet, Take 1 tablet (10 mg total) by mouth daily., Disp: 90 tablet, Rfl: 1   diclofenac sodium (VOLTAREN) 1 % GEL, Apply 4 g topically 4 (four) times daily., Disp: 100 g, Rfl: 0  Review of Systems:  ***  Constitutional: Eye: Respiratory: Cardiovascular: GI: MSK: GU: Skin: Neuro: Endocrine:   Physical Exam:  There were no vitals filed for this visit. *** General: Patient is sitting comfortably in the room  Eyes: Pupils equal and reactive to light, EOM intact  Head: Normocephalic, atraumatic  Neck: Supple, nontender, full range of motion, No JVD Cardio: Regular rate and rhythm, no murmurs, rubs or gallops. 2+ pulses to bilateral upper and lower extremities  Chest: No chest tenderness Pulmonary: Clear to ausculation bilaterally with no rales, rhonchi, and crackles  Abdomen: Soft, nontender with normoactive bowel sounds with no rebound or guarding  Neuro: Alert and orientated x3. CN II-XII intact. Sensation intact to upper and lower extremities. 2+ patellar reflex.  Back: No midline tenderness, no step off or deformities noted. No paraspinal muscle tenderness.  Skin: No rashes noted  MSK: 5/5 strength to upper and lower extremities.    Assessment & Plan:   No problem-specific Assessment & Plan notes found for this encounter.    Patient {GC/GE:[id number]::"discussed with","seen with"} Dr.  {NAMES:[id number]::"Guilloud","Hoffman","Mullen","Narendra","Williams","Vincent"}  Modena Slater, DO PGY-2 Internal Medicine Resident  Pager: [phone]

## 2022-11-20 ENCOUNTER — Encounter (HOSPITAL_COMMUNITY): Payer: Self-pay

## 2022-11-20 ENCOUNTER — Ambulatory Visit (HOSPITAL_COMMUNITY): Payer: Medicaid Other

## 2022-11-20 ENCOUNTER — Ambulatory Visit (HOSPITAL_COMMUNITY)
Admission: RE | Admit: 2022-11-20 | Discharge: 2022-11-20 | Disposition: A | Discharge status: Home / Self Care | Payer: Medicaid Other | Source: Ambulatory Visit | Attending: Family Medicine | Admitting: Family Medicine

## 2022-11-20 VITALS — BP 124/88 | HR 94 | Temp 99.1°F | Resp 18 | Ht 77.0 in | Wt 270.0 lb

## 2022-11-20 DIAGNOSIS — M79645 Pain in left finger(s): Secondary | ICD-10-CM | POA: Diagnosis not present

## 2022-11-20 MED ORDER — PREDNISONE 20 MG PO TABS: 40.0000 mg | ORAL_TABLET | Freq: Every day | ORAL | 0 refills | Status: DC

## 2022-11-20 NOTE — ED Triage Notes (Signed)
Injury to the left thumb. Patient woke up with reduced ROM in the thumb and pain. No known injuries.  Onset 3 weeks ago and getting worse.

## 2022-11-20 NOTE — ED Provider Notes (Signed)
[facility]   [id number] 11/20/22 Arrival Time: 8295  ASSESSMENT & PLAN:  1. Thumb pain, left    I have personally viewed and independently interpreted the imaging studies ordered this visit. L thumb: no acute bony changes; arthritic changes.  Trial of: New Prescriptions   PREDNISONE (DELTASONE) 20 MG TABLET    Take 2 tablets (40 mg total) by mouth daily.    Orders Placed This Encounter  Procedures   DG Finger Thumb Left   Apply Thumb spica  For comfort until hand f/u.  Recommend:  Follow-up Information     Schedule an appointment as soon as possible for a visit  with Chiaramonti, Edsel Petrin, MD.   Specialties: Orthopedic Surgery, Hand Surgery Contact information: [address] [place] [address] [phone]               Suspect trigger thumb.  Reviewed expectations re: course of current medical issues. Questions answered. Outlined signs and symptoms indicating need for more acute intervention. Patient verbalized understanding. After Visit Summary given.  SUBJECTIVE: History from: patient. Matthew Bradley is a 62 y.o. male who reports L thumb pain; approx 3 weeks; mild swelling; denies injury. Thumb does sometimes 'catch'. No extremity sensation changes or weakness. No tx PTA. Feels this is getting worse. Is R-handed.   Past Surgical History:  Procedure Laterality Date   CYST REMOVAL TRUNK Left       OBJECTIVE:  Vitals:   11/20/22 0955 11/20/22 0956  BP:  124/88  Pulse:  (!) 114  Resp:  18  Temp:  99.1 F (37.3 C)  TempSrc:  Oral  SpO2:  96%  Weight: 122.5 kg   Height: 6\' 5"  (1.956 m)     General appearance: alert; no distress HEENT: [place]; AT Neck: supple with FROM Resp: unlabored respirations Extremities: LUE: warm with well perfused appearance; moderate tenderness over left mid thumb; without gross deformities; swelling: minimal; bruising: none; thumb ROM: decreased at thumb IP joint CV: brisk extremity capillary refill of  LUE; 2+ radial pulse of LUE. Skin: warm and dry; no visible rashes Neurologic: gait normal; normal sensation and strength of LUE Psychological: alert and cooperative; normal mood and affect  Imaging: DG Finger Thumb Left  Result Date: 11/20/2022 CLINICAL DATA:  Mid thumb pain and swelling. Decreased range of motion within the thumb. EXAM: LEFT THUMB 2+V COMPARISON:  None Available. FINDINGS: No signs of acute fracture or dislocation. There is a prominent enthesophyte or osteophyte arising off the volar aspect of head of the first proximal phalanx which extends distally towards the volar plate of the distal phalanx. The soft tissues are unremarkable. IMPRESSION: 1. No acute findings. 2. Prominent enthesophyte vs osteophyte arising off the volar aspect of the head of the first proximal phalanx and extending towards the volar plate of the distal phalanx. This may account for diminished range of motion. Clinical correlation advised Electronically Signed   By: Signa Kell M.D.   On: 11/20/2022 12:56      Allergies  Allergen Reactions   Lisinopril Swelling   Other     Some metals   Latex Rash    Past Medical History:  Diagnosis Date   Hypertension    Social History   Socioeconomic History   Marital status: Single    Spouse name: Not on file   Number of children: Not on file   Years of education: Not on file   Highest education level: Not on file  Occupational History   Not on  file  Tobacco Use   Smoking status: Every Day    Current packs/day: 0.30    Types: Cigarettes   Smokeless tobacco: Never  Vaping Use   Vaping status: Never Used  Substance and Sexual Activity   Alcohol use: Yes    Comment: occasionally   Drug use: No   Sexual activity: Not Currently  Other Topics Concern   Not on file  Social History Narrative   Not on file   Social Determinants of Health   Financial Resource Strain: Not on file  Food Insecurity: No Food Insecurity (09/26/2020)   Received from  [facility], [facility]   Hunger Vital Sign    Worried About Running Out of Food in the Last Year: Never true    Ran Out of Food in the Last Year: Never true  Transportation Needs: Not on file  Physical Activity: Not on file  Stress: Not on file  Social Connections: Unknown (05/28/2021)   Received from [facility], [facility]   Social Network    Social Network: Not on file   Family History  Problem Relation Age of Onset   Diabetes Mother    Heart failure Father    Past Surgical History:  Procedure Laterality Date   CYST REMOVAL TRUNK Left        Matthew Layman, MD 11/20/22 1332

## 2022-12-04 ENCOUNTER — Ambulatory Visit: Payer: Medicaid Other | Admitting: Student

## 2022-12-04 ENCOUNTER — Encounter: Payer: Self-pay | Admitting: Student

## 2022-12-04 VITALS — BP 135/90 | HR 97 | Ht 77.0 in | Wt 275.1 lb

## 2022-12-04 DIAGNOSIS — M79645 Pain in left finger(s): Secondary | ICD-10-CM | POA: Diagnosis not present

## 2022-12-04 DIAGNOSIS — I10 Essential (primary) hypertension: Secondary | ICD-10-CM

## 2022-12-04 DIAGNOSIS — G8929 Other chronic pain: Secondary | ICD-10-CM | POA: Insufficient documentation

## 2022-12-04 DIAGNOSIS — Z23 Encounter for immunization: Secondary | ICD-10-CM | POA: Diagnosis not present

## 2022-12-04 DIAGNOSIS — Z0000 Encounter for general adult medical examination without abnormal findings: Secondary | ICD-10-CM

## 2022-12-04 DIAGNOSIS — E785 Hyperlipidemia, unspecified: Secondary | ICD-10-CM

## 2022-12-04 NOTE — Progress Notes (Signed)
CC: follow up appointment  HPI:  [name] is a 62 y.o. male living with a history stated below and presents today for follow up appointment. Please see problem based assessment and plan for additional details.  Past Medical History:  Diagnosis Date   Hypertension     Current Outpatient Medications on File Prior to Visit  Medication Sig Dispense Refill   albuterol (VENTOLIN HFA) 108 (90 Base) MCG/ACT inhaler Inhale 2 puffs into the lungs every 4 (four) hours as needed for wheezing or shortness of breath (or coughing). 18 g 0   amLODipine (NORVASC) 10 MG tablet Take 1 tablet (10 mg total) by mouth daily. 90 tablet 1   diclofenac sodium (VOLTAREN) 1 % GEL Apply 4 g topically 4 (four) times daily. 100 g 0   predniSONE (DELTASONE) 20 MG tablet Take 2 tablets (40 mg total) by mouth daily. 10 tablet 0   No current facility-administered medications on file prior to visit.    Family History  Problem Relation Age of Onset   Diabetes Mother    Heart failure Father     Social History   Socioeconomic History   Marital status: Single    Spouse name: Not on file   Number of children: Not on file   Years of education: Not on file   Highest education level: Not on file  Occupational History   Not on file  Tobacco Use   Smoking status: Every Day    Current packs/day: 0.30    Types: Cigarettes   Smokeless tobacco: Never  Vaping Use   Vaping status: Never Used  Substance and Sexual Activity   Alcohol use: Yes    Comment: occasionally   Drug use: No   Sexual activity: Not Currently  Other Topics Concern   Not on file  Social History Narrative   Not on file   Social Determinants of Health   Financial Resource Strain: Not on file  Food Insecurity: No Food Insecurity (09/26/2020)   Received from [facility], [facility]   Hunger Vital Sign    Worried About Running Out of Food in the Last Year: Never true    Ran Out of Food in the Last Year: Never true   Transportation Needs: Not on file  Physical Activity: Not on file  Stress: Not on file  Social Connections: Unknown (05/28/2021)   Received from [facility], [facility]   Social Network    Social Network: Not on file  Intimate Partner Violence: Unknown (04/19/2021)   Received from [facility], [facility]   HITS    Physically Hurt: Not on file    Insult or Talk Down To: Not on file    Threaten Physical Harm: Not on file    Scream or Curse: Not on file   Review of Systems: ROS negative except for what is noted on the assessment and plan.  Vitals:   12/04/22 0856 12/04/22 0920  BP: (!) 150/99 (!) 135/90  Pulse: (!) 103 97  SpO2: 98%   Weight: 275 lb 1.6 oz (124.8 kg)   Height: 6\' 5"  (1.956 m)    Physical Exam: Constitutional: well appearing MSK: normal bulk and tone; mildly decreased ROM of left thumb, no bony abnormalities, swelling, or tenderness appreciated. Finkelstein negative.  Neurological: alert & oriented x 3, no focal deficits Skin: warm and dry Psych: normal mood and behavior  Assessment & Plan:   Patient seen with Dr. Sol Blazing  Primary hypertension Blood pressure elevated at 150/99, down  on repeat to 135/90. He recently went to the urgent care and it was lower at 124/88.  He is asymptomatic.  Since his blood pressure was normal on a recent visit, we will not make any changes to her blood pressure medication at this time.  Will continue amlodipine 10 mg daily.  Encouraged the patient to check and log his blood pressure at home and bring it to his next visit.  Hyperlipidemia During the patient's last visit in February, his ASCVD risk was calculated at 23.8%.  At that time, the patient declined starting a statin and preferred to focus on lifestyle therapy.  Since then, he says he has made dietary changes and is eating better, as well as increasing his daily exercise.  I spoke with him about the risk associated with poorly controlled cholesterol and lipids, and the  benefits of taking a statin.  We will recheck his lipid panel today.  Once it is resulted, I will call him to discuss further lifestyle interventions versus statin initiation.  Chronic thumb pain, left Patient recently went to the urgent care for left thumb pain.  He states it worsens with repetitive movements.  On exam, no swelling or tenderness were appreciated.  No bony deformities were present.  Finkelstein test was negative.  Recent x-ray showed an osteophyte in the first digit.  He has an appointment scheduled with orthopedics on 11/25 to follow up on this issue.  In the meantime, I recommended he use his thumb spica splint and Voltaren gel as needed on the thumb.  Annett Fabian, MD  [facility] Internal Medicine, PGY-1 Phone: [phone] Date 12/04/2022 Time 10:05 AM

## 2022-12-04 NOTE — Assessment & Plan Note (Addendum)
Blood pressure elevated at 150/99, down on repeat to 135/90. He recently went to the urgent care and it was lower at 124/88.  He is asymptomatic.  Since his blood pressure was normal on a recent visit, we will not make any changes to her blood pressure medication at this time.  Will continue amlodipine 10 mg daily.  Encouraged the patient to check and log his blood pressure at home and bring it to his next visit.

## 2022-12-04 NOTE — Patient Instructions (Signed)
Thank you, [name] for allowing [place] to provide your care today. Today we discussed your left thumb pain and blood pressure.  As we discussed, your blood pressure was a little elevated today but has been normal at recent visits, so we would not make any changes in medications.  He can continue amlodipine 10 mg daily.  Please check your blood pressure regularly and record it, bringing it to your next visit.   For your thumb pain, I recommend continuing to use the thumb spica splint and requesting other options from the orthopedic clinic when you have your appointment that you may be able to tolerate more.  You may use Voltaren gel on your thumb as needed or ibuprofen for pain.   We have rechecked your cholesterol and lipids today.  I will call you as soon as I have the results.  I have also sent a referral for colonoscopy and they should be contacting you soon.  Follow up: 3 months   We look forward to seeing you next time. Please call our clinic at [phone] if you have any questions or concerns. The best time to call is Monday-Friday from 9am-4pm, but there is someone available 24/7. If after hours or the weekend, call the main hospital number and ask for the Internal Medicine Resident On-Call. If you need medication refills, please notify your pharmacy one week in advance and they will send [place] a request.   Thank you for trusting me with your care. Wishing you the best!   Annett Fabian, MD [facility] Center

## 2022-12-04 NOTE — Assessment & Plan Note (Signed)
Patient recently went to the urgent care for left thumb pain.  He states it worsens with repetitive movements.  On exam, no swelling or tenderness were appreciated.  No bony deformities were present.  Finkelstein test was negative.  Recent x-ray showed an osteophyte in the first digit.  He has an appointment scheduled with orthopedics on 11/25 to follow up on this issue.  In the meantime, I recommended he use his thumb spica splint and Voltaren gel as needed on the thumb.

## 2022-12-04 NOTE — Assessment & Plan Note (Signed)
During the patient's last visit in February, his ASCVD risk was calculated at 23.8%.  At that time, the patient declined starting a statin and preferred to focus on lifestyle therapy.  Since then, he says he has made dietary changes and is eating better, as well as increasing his daily exercise.  I spoke with him about the risk associated with poorly controlled cholesterol and lipids, and the benefits of taking a statin.  We will recheck his lipid panel today.  Once it is resulted, I will call him to discuss further lifestyle interventions versus statin initiation.

## 2022-12-05 LAB — LIPID PANEL
Chol/HDL Ratio: 2.9 ratio (ref 0.0–5.0)
Cholesterol, Total: 218 mg/dL — ABNORMAL HIGH (ref 100–199)
HDL: 76 mg/dL (ref >39)
LDL Chol Calc (NIH): 129 mg/dL — ABNORMAL HIGH (ref 0–99)
Triglycerides: 75 mg/dL (ref 0–149)
VLDL Cholesterol Cal: 13 mg/dL (ref 5–40)

## 2022-12-05 NOTE — Progress Notes (Signed)
Internal Medicine Clinic Attending  I saw and evaluated the patient.  I personally confirmed the key portions of the history and exam documented by Dr. Versie Starks and I reviewed pertinent patient test results.  The assessment, diagnosis, and plan were formulated together and I agree with the documentation in the resident's note.

## 2022-12-08 DIAGNOSIS — M65312 Trigger thumb, left thumb: Secondary | ICD-10-CM | POA: Insufficient documentation

## 2022-12-09 ENCOUNTER — Other Ambulatory Visit: Payer: Self-pay | Admitting: Student

## 2022-12-09 MED ORDER — ATORVASTATIN CALCIUM 40 MG PO TABS: 40.0000 mg | ORAL_TABLET | Freq: Every day | ORAL | 11 refills | Status: AC

## 2022-12-09 NOTE — Progress Notes (Signed)
Called and spoke with him. ASCVD risk is 23%. He will start atorvastatin 40 mg daily.

## 2023-02-26 ENCOUNTER — Other Ambulatory Visit: Payer: Self-pay | Admitting: Student

## 2023-02-26 NOTE — Telephone Encounter (Signed)
Medication sent to pharmacy

## 2023-03-05 ENCOUNTER — Encounter: Payer: Medicaid Other | Admitting: Student

## 2023-03-11 ENCOUNTER — Telehealth: Payer: Self-pay | Admitting: *Deleted

## 2023-03-11 NOTE — Telephone Encounter (Signed)
 Call from pt wanting to know if he will be seen tomorrow or be turned away. I asked pt why would he thinks so; he stated we may think he had covid or something. I asked if he has been around anyone sick, he stated no. I asked about his symptoms - he stated some dizziness when he stands up and sweating more at night which started after he had a cortisone shot for trigger finger yesterday. Denies having a fever - stated his BP and temp were ok at the office yesterday. No cough. He did get the flu shot. I told pt we will see him tomorrow at his appt @ 1515PM. But any change or worsen in his symptoms to go UC or ER - stated he understands.

## 2023-03-12 ENCOUNTER — Encounter: Payer: Medicaid Other | Admitting: Student

## 2023-03-24 ENCOUNTER — Encounter: Payer: Self-pay | Admitting: Student

## 2023-03-24 ENCOUNTER — Ambulatory Visit: Payer: Medicaid Other | Admitting: Student

## 2023-03-24 VITALS — BP 146/96 | HR 107 | Temp 98.2°F | Ht 77.0 in | Wt 276.1 lb

## 2023-03-24 DIAGNOSIS — Z122 Encounter for screening for malignant neoplasm of respiratory organs: Secondary | ICD-10-CM

## 2023-03-24 DIAGNOSIS — I10 Essential (primary) hypertension: Secondary | ICD-10-CM | POA: Diagnosis present

## 2023-03-24 DIAGNOSIS — I951 Orthostatic hypotension: Secondary | ICD-10-CM

## 2023-03-24 DIAGNOSIS — Z1211 Encounter for screening for malignant neoplasm of colon: Secondary | ICD-10-CM

## 2023-03-24 DIAGNOSIS — F1090 Alcohol use, unspecified, uncomplicated: Secondary | ICD-10-CM | POA: Diagnosis not present

## 2023-03-24 NOTE — Assessment & Plan Note (Signed)
 Hypertensive but orthostatic. Cannot further increase his medicines. 160/92 and 146/96. - Continue amlodipine 10 every day, his longtime regimen. Might need to increase once orthostatic resolves.

## 2023-03-24 NOTE — Assessment & Plan Note (Signed)
 Orthostatic vitals + with greater than 20 mmHg systolic BP change on standing. Matthew Bradley complains of 2 weeks of dizziness that is strongly associated with standing. He denies nausea or vomiting or falls. The symptom goes away within a few moments and is relieved when he drinks fluids. Blood pressure regimen or other meds without recent changes. What has changes is alcohol consumption - noting he drinks 80+ oz beer daily as he is between jobs and has more free time. I suspect this is the cause of his issue. Exam does not reveal that he is grossly hypovolemic. - Advised reducing his alcohol. He is very agreeable. Will not adjust BP meds today, expect he will need more in future when he is no longer orthostatic. - Rec hydration and slow standing

## 2023-03-24 NOTE — Patient Instructions (Addendum)
 Matthew Bradley,  Thank you for coming in today.  I believe your dizziness is due to a condition called "orthostatic hypotension" which means your blood pressure drops too much when you stand. You still have "hypertension," high blood pressure, but if your blood pressure changes a lot when you stand, it can cause symptoms even if your pressure remains high.  I believe the cause is your alcohol use. Heavy alcohol use is a cause of orthostatic hypotension because it can dehydrate you and cause your blood pressure to drop. Please reduce your alcohol consumption. I do not want you to have a fall and hurt yourself.  Additionally, drink more water and stand up slow.  We will check a few labs today to assess your blood, kidneys, liver, and electrolytes.  Please return in 1-2 months.  Thank you, Dr Ninfa Meeker

## 2023-03-24 NOTE — Assessment & Plan Note (Addendum)
 Ref to GI for colonoscopy. Looks like there have been multiple recent referrals.

## 2023-03-24 NOTE — Progress Notes (Signed)
   CC: dizziness  HPI:  Matthew.Matthew Bradley is a 63 y.o. male living with a history stated below and presents today for BP check and dizziness. Please see problem based assessment and plan for additional details.  Past Medical History:  Diagnosis Date   Hypertension     Review of Systems: ROS negative except for what is noted on the assessment and plan.  Vitals:   03/24/23 0853 03/24/23 0915  BP: (!) 160/92 (!) 146/96  Pulse: (!) 107   Temp: 98.2 F (36.8 C)   TempSrc: Oral   SpO2: 97%   Weight: 276 lb 1.6 oz (125.2 kg)   Height: 6\' 5"  (1.956 m)     Physical Exam: Constitutional: well-appearing man sitting in chair, in no acute distress Cardiovascular: regular rate and rhythm, no m/r/g Pulmonary/Chest: normal work of breathing on room air, lungs clear to auscultation bilaterally Abdominal: soft, non-tender, non-distended MSK: normal bulk and tone Neurological: alert & oriented x 3, no focal deficit Skin: warm and dry Psych: normal mood and behavior  Assessment & Plan:   Patient discussed with Dr.  Deirdre Priest  Orthostatic hypotension Orthostatic vitals + with greater than 20 mmHg systolic BP change on standing. Matthew Bradley complains of 2 weeks of dizziness that is strongly associated with standing. He denies nausea or vomiting or falls. The symptom goes away within a few moments and is relieved when he drinks fluids. Blood pressure regimen or other meds without recent changes. What has changes is alcohol consumption - noting he drinks 80+ oz beer daily as he is between jobs and has more free time. I suspect this is the cause of his issue. Exam does not reveal that he is grossly hypovolemic. - Advised reducing his alcohol. He is very agreeable. Will not adjust BP meds today, expect he will need more in future when he is no longer orthostatic. - Rec hydration and slow standing  Primary hypertension Hypertensive but orthostatic. Cannot further increase his medicines. 160/92 and  146/96. - Continue amlodipine 10 every day, his longtime regimen. Might need to increase once orthostatic resolves.  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 He feels he can cut back and will try. Can consider medicines to reduce cravings at future visits as indicated. - CBC and CMP - Would reassess at next visit and consider vitamins/acamprosate/etc  Screening for lung cancer Current smoker. Low dose CT ordered.  Colon cancer screening Ref to GI for colonoscopy. Looks like there have been multiple recent referrals.  RTC in 6 weeks to assess orthostatics, alcohol use, blood pressure.  [name], D.O. [facility] Internal Medicine, PGY-1 Phone: [phone] Date 03/24/2023 Time 1:54 PM

## 2023-03-24 NOTE — Assessment & Plan Note (Signed)
 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 He feels he can cut back and will try. Can consider medicines to reduce cravings at future visits as indicated. - CBC and CMP - Would reassess at next visit and consider vitamins/acamprosate/etc

## 2023-03-24 NOTE — Assessment & Plan Note (Signed)
 Current smoker. Low dose CT ordered.

## 2023-03-25 LAB — CMP14 + ANION GAP
ALT: 24 IU/L (ref 0–44)
AST: 25 IU/L (ref 0–40)
Albumin: 4.3 g/dL (ref 3.9–4.9)
Alkaline Phosphatase: 69 IU/L (ref 44–121)
Anion Gap: 16 mmol/L (ref 10.0–18.0)
BUN/Creatinine Ratio: 10 (ref 10–24)
BUN: 10 mg/dL (ref 8–27)
Bilirubin Total: 0.8 mg/dL (ref 0.0–1.2)
CO2: 22 mmol/L (ref 20–29)
Calcium: 10.2 mg/dL (ref 8.6–10.2)
Chloride: 105 mmol/L (ref 96–106)
Creatinine, Ser: 0.98 mg/dL (ref 0.76–1.27)
Globulin, Total: 3.2 g/dL (ref 1.5–4.5)
Glucose: 92 mg/dL (ref 70–99)
Potassium: 3.9 mmol/L (ref 3.5–5.2)
Sodium: 143 mmol/L (ref 134–144)
Total Protein: 7.5 g/dL (ref 6.0–8.5)
eGFR: 88 mL/min/{1.73_m2} (ref >59)

## 2023-03-25 LAB — CBC
Hematocrit: 45.7 % (ref 37.5–51.0)
Hemoglobin: 15.5 g/dL (ref 13.0–17.7)
MCH: 30.9 pg (ref 26.6–33.0)
MCHC: 33.9 g/dL (ref 31.5–35.7)
MCV: 91 fL (ref 79–97)
Platelets: 233 10*3/uL (ref 150–450)
RBC: 5.01 x10E6/uL (ref 4.14–5.80)
RDW: 13.1 % (ref 11.6–15.4)
WBC: 4.9 10*3/uL (ref 3.4–10.8)

## 2023-03-25 NOTE — Progress Notes (Signed)
 Internal Medicine Clinic Attending  Case discussed with the resident at the time of the visit.  We reviewed the resident's history and exam and pertinent patient test results.  I agree with the assessment, diagnosis, and plan of care documented in the resident's note.

## 2023-03-27 ENCOUNTER — Encounter: Payer: Self-pay | Admitting: Student

## 2023-03-27 NOTE — Telephone Encounter (Signed)
 Copied from CRM [id number]. Topic: Appointments - Scheduling Inquiry for Clinic >> Mar 26, 2023 12:32 PM Prudencio Pair wrote: Reason for CRM: Patient states he had a visit with a provider on Tuesday of this week. States provider wanted him to be scheduled for a CT scan & colonoscopy. Patient stated that he received a call from someone to schedule but he was not in position to take down the information because he was walking down the street. There is no upcoming appt information scheduled for a CT scan or colonoscopy. Please give patient a call back at [phone] to get patient scheduled.

## 2023-03-27 NOTE — Telephone Encounter (Signed)
 Copied from CRM [id number]. Topic: Appointments - Scheduling Inquiry for Clinic >> Mar 26, 2023 12:32 PM Prudencio Pair wrote: Reason for CRM: Patient states he had a visit with a provider on Tuesday of this week. States provider wanted him to be scheduled for a CT scan & colonoscopy. Patient stated that he received a call from someone to schedule but he was not in position to take down the information because he was walking down the street. There is no upcoming appt information scheduled for a CT scan or colonoscopy. Please give patient a call back at [phone] to get patient scheduled.  Pt has been Authorized and has now called and sch his appointment for the following:  Name: Matthew Bradley, Matthew Bradley MRN: [id number]  Date: 04/21/2023 Status: Sch  Time: 8:45 AM Length: 15  Visit Type: GI CT LCS 20 MIN [[id number]] Copay: $0.00  Provider: GI-315 CT 2 Department: GI-315 CT IMAGING  Referring Provider: Reymundo Poll CSN: [id number]       The patient GI referral was also already sent to the following office who will reach out to the patient to be sch.  [facility] Gastroenterologist in [place], [place] Located in: [place] [facility] [address] Address: [address], [place], [place] [address]  Phone: [phone]

## 2023-04-03 ENCOUNTER — Encounter

## 2023-04-03 ENCOUNTER — Telehealth: Payer: Self-pay | Admitting: *Deleted

## 2023-04-03 NOTE — Telephone Encounter (Signed)
 Attempt to reach pt for pre-visit. No answer  Will attempt to reach again in 5 min due to no other # listed in profile  Second attempt to reach pt for pre-vist unsuccessful. No answer.

## 2023-04-15 ENCOUNTER — Encounter: Admitting: Internal Medicine

## 2023-04-21 ENCOUNTER — Ambulatory Visit
Admission: RE | Admit: 2023-04-21 | Discharge: 2023-04-21 | Disposition: A | Discharge status: Home / Self Care | Source: Ambulatory Visit | Attending: Internal Medicine | Admitting: Internal Medicine

## 2023-04-21 DIAGNOSIS — Z122 Encounter for screening for malignant neoplasm of respiratory organs: Secondary | ICD-10-CM

## 2023-05-05 ENCOUNTER — Encounter: Admitting: Internal Medicine

## 2023-05-05 NOTE — Progress Notes (Deleted)
 Subjective:  CC: ***  HPI:  [name] is a 62 y.o. male with a past medical history of tobacco use disorder, HTN who presents today for orthostatics, alcohol use, and blood pressure.   He was seen March 11 for dizziness. Blood pressures were consistently elevated however he was orthostatic so blood pressure medications were not changed at that time.  Dizziness was thought to be secondary to alcohol consumption.  He reported that he was drinking greater than 80 ounces of beer daily.  Please see problem based assessment and plan for additional details.  Past Medical History:  Diagnosis Date   Hypertension     MEDICATIONS:  Toprol-HCTZ 20-25 mg Amlodipine  10 mg Albuterol  Voltaren  gel Atorvastatin  40 mg  Family History  Problem Relation Age of Onset   Diabetes Mother    Heart failure Father     Past Surgical History:  Procedure Laterality Date   CYST REMOVAL TRUNK Left      Social History   Socioeconomic History   Marital status: Single    Spouse name: Not on file   Number of children: Not on file   Years of education: Not on file   Highest education level: Not on file  Occupational History   Not on file  Tobacco Use   Smoking status: Every Day    Current packs/day: 0.30    Types: Cigarettes   Smokeless tobacco: Never  Vaping Use   Vaping status: Never Used  Substance and Sexual Activity   Alcohol use: Yes    Comment: occasionally   Drug use: No   Sexual activity: Not Currently  Other Topics Concern   Not on file  Social History Narrative   Not on file   Social Drivers of Health   Financial Resource Strain: Not on file  Food Insecurity: No Food Insecurity (09/26/2020)   Received from [facility], [facility]   Hunger Vital Sign    Worried About Running Out of Food in the Last Year: Never true    Ran Out of Food in the Last Year: Never true  Transportation Needs: Not on file  Physical Activity: Not on file  Stress: Not on file   Social Connections: Unknown (05/28/2021)   Received from [facility], [facility]   Social Network    Social Network: Not on file  Intimate Partner Violence: Unknown (04/19/2021)   Received from [facility]   HITS    Physically Hurt: Not on file    Insult or Talk Down To: Not on file    Threaten Physical Harm: Not on file    Scream or Curse: Not on file    Review of Systems: ROS negative except for what is noted on the assessment and plan.  Objective:  There were no vitals filed for this visit.  Physical Exam: Constitutional: well-appearing *** sitting in ***, in no acute distress HENT: normocephalic atraumatic, mucous membranes moist Eyes: conjunctiva non-erythematous Neck: supple Cardiovascular: regular rate and rhythm, no m/r/g Pulmonary/Chest: normal work of breathing on room air, lungs clear to auscultation bilaterally Abdominal: soft, non-tender, non-distended MSK: normal bulk and tone Neurological: alert & oriented x 3, 5/5 strength in bilateral upper and lower extremities, normal gait Skin: warm and dry Psych: ***     Assessment & Plan:  No problem-specific Assessment & Plan notes found for this encounter.    Patient {GC/GE:[id number]::"discussed with","seen with"} Dr. {WUJWJ:[id number]::"NFAOZHYQ","M. Hoffman","Mullen","Narendra","Machen","Vincent","Guilloud","Lau"}   [employer], D.O. [facility] Internal Medicine  PGY-3 Pager:  [phone]  Phone: [phone] Date 05/05/2023  Time 8:35 AM

## 2023-11-16 ENCOUNTER — Ambulatory Visit: Payer: Self-pay | Admitting: Student

## 2023-11-16 ENCOUNTER — Encounter: Payer: Self-pay | Admitting: Student

## 2023-11-19 ENCOUNTER — Encounter: Payer: Self-pay | Admitting: Student

## 2023-11-19 ENCOUNTER — Ambulatory Visit: Payer: Self-pay | Admitting: Student

## 2023-11-19 ENCOUNTER — Telehealth: Payer: Self-pay | Admitting: *Deleted

## 2023-11-19 VITALS — BP 149/90 | HR 88 | Ht 77.0 in | Wt 259.8 lb

## 2023-11-19 DIAGNOSIS — F17210 Nicotine dependence, cigarettes, uncomplicated: Secondary | ICD-10-CM | POA: Insufficient documentation

## 2023-11-19 DIAGNOSIS — I10 Essential (primary) hypertension: Secondary | ICD-10-CM | POA: Diagnosis not present

## 2023-11-19 DIAGNOSIS — Z1211 Encounter for screening for malignant neoplasm of colon: Secondary | ICD-10-CM | POA: Diagnosis not present

## 2023-11-19 DIAGNOSIS — I7121 Aneurysm of the ascending aorta, without rupture: Secondary | ICD-10-CM | POA: Diagnosis not present

## 2023-11-19 DIAGNOSIS — E785 Hyperlipidemia, unspecified: Secondary | ICD-10-CM | POA: Diagnosis not present

## 2023-11-19 DIAGNOSIS — F1090 Alcohol use, unspecified, uncomplicated: Secondary | ICD-10-CM

## 2023-11-19 MED ORDER — CARVEDILOL 3.125 MG PO TABS: 3.1250 mg | ORAL_TABLET | Freq: Two times a day (BID) | ORAL | 3 refills | Status: DC

## 2023-11-19 MED ORDER — VARENICLINE TARTRATE 0.5 MG PO TABS: ORAL_TABLET | ORAL | 0 refills | Status: DC

## 2023-11-19 MED ORDER — NALTREXONE HCL 50 MG PO TABS: 50.0000 mg | ORAL_TABLET | Freq: Every day | ORAL | 3 refills | Status: AC

## 2023-11-19 MED ORDER — VARENICLINE TARTRATE 1 MG PO TABS: 1.0000 mg | ORAL_TABLET | Freq: Two times a day (BID) | ORAL | 1 refills | Status: DC

## 2023-11-19 NOTE — Assessment & Plan Note (Signed)
 40-pack-year smoking history, currently 1 pack/day. Prior trial of nicotine gum ineffective. No history of nightmares or PTSD. Motivated to quit due to abdominal aortic aneurysm. -Start varenicline (Chantix): 0.5 mg daily  3 days ? 0.5 mg BID  4 days ? 1 mg BID for 12 weeks. -Begin 1-2 weeks before quit date. -Follow-up in 4 to 6 weeks for cigarette smoking cessation.

## 2023-11-19 NOTE — Telephone Encounter (Signed)
 11-19-23 1210 I called Matthew Bradley, verified name and DOB. Asked patient to return for recollect of lab samples. Hemolyzed.  Patient agreed to return tomorrow morning. 11-20-23  He has already returned to work for the day. I encouraged Matthew Bradley to drink water to hydrate. Dr Celestina is aware of the rescheduled lab visit.  [place] [facility] 11-19-2023 1226

## 2023-11-19 NOTE — Assessment & Plan Note (Signed)
 Referred to GI for colonoscopy

## 2023-11-19 NOTE — Progress Notes (Signed)
 Internal Medicine Clinic Attending  Case discussed with the resident at the time of the visit.  We reviewed the resident's history and exam and pertinent patient test results.  I agree with the assessment, diagnosis, and plan of care documented in the resident's note.

## 2023-11-19 NOTE — Progress Notes (Signed)
   CC: Annual physical exam  HPI:  Matthew Bradley is a 63 y.o. male with a past medical history of hypertension and alcohol use disorder, who presents for follow-up, and annual physical exam  Please see problem based assessment and plan for additional details.  Past Medical History:  Diagnosis Date   Hypertension     Current Outpatient Medications on File Prior to Visit  Medication Sig Dispense Refill   albuterol  (VENTOLIN  HFA) 108 (90 Base) MCG/ACT inhaler Inhale 2 puffs into the lungs every 4 (four) hours as needed for wheezing or shortness of breath (or coughing). 18 g 0   amLODipine  (NORVASC ) 10 MG tablet TAKE 1 TABLET BY MOUTH EVERY DAY 90 tablet 1   atorvastatin  (LIPITOR) 40 MG tablet Take 1 tablet (40 mg total) by mouth daily. 30 tablet 11   diclofenac  sodium (VOLTAREN ) 1 % GEL Apply 4 g topically 4 (four) times daily. 100 g 0   Ipratropium-Albuterol  (ALBUTEROL -IPRATROPIUM IN) Inhale into the lungs.     No current facility-administered medications on file prior to visit.    Review of Systems: ROS negative except for what is noted on the assessment and plan.  Vitals:   11/19/23 0900 11/19/23 0914  BP: (!) 153/90 (!) 149/90  Pulse: 85 88  SpO2: 95%   Weight: 259 lb 12.8 oz (117.8 kg)   Height: 6' 5 (1.956 m)     Physical Exam: Constitutional: NAD Cardiovascular: RRR, no murmurs. Pulmonary/Chest: Clear bilateral lungs Abdominal: soft, non-tender, non-distended. Extremity: Warm and dry; no edema.  Upper and lower extremity with normal strength  Assessment & Plan:   Patient discussed with Dr. Shawn  Assessment & Plan Primary hypertension Negative orthostatics; denies dizziness. Pulse regular.  Hypertension, suboptimally controlled on amlodipine  10 mg daily. Given history of ascending aortic aneurysm (4.0 cm), will add carvedilol for improved blood pressure control and to reduce aortic wall stress. - Continue amlodipine  10 mg daily. - Start carvedilol 3.125  mg BID. - Monitor BP and HR. - Reinforce adherence and smoking cessation. Aneurysm of ascending aorta without rupture Ascending aortic aneurysm measuring 4.0 cm on CT. Patient is currently asymptomatic--denies chest pain, shortness of breath, or back pain. No family history of aneurysm or connective tissue disorders reported.  Risk factors include long-standing hypertension and active tobacco use. Blood pressure remains suboptimally controlled on current regimen. - Referred to vascular surgery for further evaluation. - Better blood pressure control; added carvedilol as above. - Lifestyle modification including smoking cessation; starting Chantix as below. Heavy alcohol use Patient drinks about 80 ounces of beer daily.  LFTs 8 months ago and normal.  He is agreeable to starting naltrexone today. -Start naltrexone 50 mg daily. -Recheck CMP. Hyperlipidemia, unspecified hyperlipidemia type Will check lipids  Colon cancer screening -Referred to GI for colonoscopy. Cigarette smoker 40-pack-year smoking history, currently 1 pack/day. Prior trial of nicotine gum ineffective. No history of nightmares or PTSD. Motivated to quit due to abdominal aortic aneurysm. -Start varenicline (Chantix): 0.5 mg daily  3 days ? 0.5 mg BID  4 days ? 1 mg BID for 12 weeks. -Begin 1-2 weeks before quit date. -Follow-up in 4 to 6 weeks for cigarette smoking cessation.  Orders Placed This Encounter  Procedures   Ambulatory referral to Vascular Surgery   Ambulatory referral to Gastroenterology    Missy Sandhoff, MD [facility] Internal Medicine, PGY-2  Date 11/19/2023 Time 5:00 PM

## 2023-11-19 NOTE — Assessment & Plan Note (Signed)
 Patient drinks about 80 ounces of beer daily.  LFTs 8 months ago and normal.  He is agreeable to starting naltrexone today. -Start naltrexone 50 mg daily. -Recheck CMP.

## 2023-11-19 NOTE — Patient Instructions (Signed)
 It was a pleasure taking care of you today!    Blood Pressure: I've added a new medication called Carvedilol 3.125 mg -- take 1 tablet twice daily (morning and evening). Continue taking Amlodipine  10 mg once daily.  Alcohol Use: I've started you on Naltrexone 50 mg once daily to help reduce alcohol cravings.  Colon Cancer Screening: A referral to Gastroenterology (GI) has been sent so you can schedule a colonoscopy.  Aortic Aneurysm and Smoking: We discussed your aneurysm. To help prevent it from getting larger, it's very important to control your blood pressure and cut down or quit smoking.  I have placed a referral for you to see vascular surgery.  I recommend trying a medicine called Chantix (Varenicline) to help you stop smoking.  How to take Chantix:  Days 1-3: Take 0.5 mg once daily.  Days 4-7: Take 0.5 mg twice daily (morning and evening).  Weeks 2-12: Take 1 mg twice daily (morning and evening).  Important: Start taking Chantix 1-2 weeks before your planned quit date.  I have ordered the following labs for you:  Lab Orders         Lipid Profile         Comprehensive metabolic panel with GFR       Follow up: 6-8 weeks    Should you have any questions or concerns please call the internal medicine clinic at [phone].     Missy Sandhoff, MD  [facility]

## 2023-11-19 NOTE — Assessment & Plan Note (Addendum)
 Negative orthostatics; denies dizziness. Pulse regular.  Hypertension, suboptimally controlled on amlodipine  10 mg daily. Given history of ascending aortic aneurysm (4.0 cm), will add carvedilol for improved blood pressure control and to reduce aortic wall stress. - Continue amlodipine  10 mg daily. - Start carvedilol 3.125 mg BID. - Monitor BP and HR. - Reinforce adherence and smoking cessation.

## 2023-11-19 NOTE — Assessment & Plan Note (Signed)
 Will check lipids.

## 2023-11-19 NOTE — Assessment & Plan Note (Signed)
 Ascending aortic aneurysm measuring 4.0 cm on CT. Patient is currently asymptomatic--denies chest pain, shortness of breath, or back pain. No family history of aneurysm or connective tissue disorders reported.  Risk factors include long-standing hypertension and active tobacco use. Blood pressure remains suboptimally controlled on current regimen. - Referred to vascular surgery for further evaluation. - Better blood pressure control; added carvedilol as above. - Lifestyle modification including smoking cessation; starting Chantix as below.

## 2023-11-20 ENCOUNTER — Ambulatory Visit: Payer: Self-pay | Admitting: Student

## 2023-11-20 ENCOUNTER — Telehealth: Payer: Self-pay | Admitting: *Deleted

## 2023-11-20 NOTE — Telephone Encounter (Signed)
 Copied from CRM [id number]. Topic: General - Other >> Nov 20, 2023 10:17 AM Chiquita SQUIBB wrote: Reason for CRM: Patient is calling in to let the provider know he was suppose to come in today to re draw his lab work due to dehydration, patient stated that he is still dehydration today so he is going to wait until Monday.

## 2023-11-23 ENCOUNTER — Other Ambulatory Visit

## 2023-11-24 ENCOUNTER — Other Ambulatory Visit (INDEPENDENT_AMBULATORY_CARE_PROVIDER_SITE_OTHER)

## 2023-11-24 DIAGNOSIS — E785 Hyperlipidemia, unspecified: Secondary | ICD-10-CM | POA: Diagnosis not present

## 2023-11-24 DIAGNOSIS — I10 Essential (primary) hypertension: Secondary | ICD-10-CM

## 2023-11-25 ENCOUNTER — Other Ambulatory Visit: Payer: Self-pay

## 2023-11-25 ENCOUNTER — Telehealth: Payer: Self-pay

## 2023-11-25 LAB — COMPREHENSIVE METABOLIC PANEL WITH GFR
ALT: 23 IU/L (ref 0–44)
AST: 30 IU/L (ref 0–40)
Albumin: 4.3 g/dL (ref 3.9–4.9)
Alkaline Phosphatase: 56 IU/L (ref 47–123)
BUN/Creatinine Ratio: 7 — ABNORMAL LOW (ref 10–24)
BUN: 7 mg/dL — ABNORMAL LOW (ref 8–27)
Bilirubin Total: 0.6 mg/dL (ref 0.0–1.2)
CO2: 23 mmol/L (ref 20–29)
Calcium: 10.5 mg/dL — ABNORMAL HIGH (ref 8.6–10.2)
Chloride: 101 mmol/L (ref 96–106)
Creatinine, Ser: 0.97 mg/dL (ref 0.76–1.27)
Globulin, Total: 3.1 g/dL (ref 1.5–4.5)
Glucose: 121 mg/dL — ABNORMAL HIGH (ref 70–99)
Potassium: 4.1 mmol/L (ref 3.5–5.2)
Sodium: 140 mmol/L (ref 134–144)
Total Protein: 7.4 g/dL (ref 6.0–8.5)
eGFR: 88 mL/min/1.73 (ref >59)

## 2023-11-25 LAB — LIPID PANEL
Chol/HDL Ratio: 2.8 ratio (ref 0.0–5.0)
Cholesterol, Total: 206 mg/dL — ABNORMAL HIGH (ref 100–199)
HDL: 74 mg/dL (ref >39)
LDL Chol Calc (NIH): 115 mg/dL — ABNORMAL HIGH (ref 0–99)
Triglycerides: 99 mg/dL (ref 0–149)
VLDL Cholesterol Cal: 17 mg/dL (ref 5–40)

## 2023-11-25 NOTE — Telephone Encounter (Signed)
 Called patient to discuss recent laboratory results including CMP and lipid panel.  Patient recently initiated naltrexone for reduction of alcohol cravings. Liver associated enzymes are within normal limits.  Calcium  mildly elevated to 10.5, however albumin also slightly elevated.  Lipid panel showing total cholesterol still elevated at 206 with LDL of 115 which is not at goal considering patient's history of aortic atherosclerosis.  ASCVD risk of 27.6% with recent results.  Patient states that he has not been taking atorvastatin  40 mg due to feeling dizzy/off-balance.  Instructed the patient to try taking this medication at night before he goes to bed.  Explained that we may check lipids again a few months later.  Patient understands and is agreeable with plan of taking atorvastatin .

## 2023-11-30 ENCOUNTER — Encounter

## 2023-12-03 ENCOUNTER — Ambulatory Visit

## 2023-12-03 VITALS — BP 130/81 | HR 84 | Resp 20 | Ht 77.0 in | Wt 259.0 lb

## 2023-12-03 DIAGNOSIS — I7121 Aneurysm of the ascending aorta, without rupture: Secondary | ICD-10-CM | POA: Insufficient documentation

## 2023-12-03 NOTE — Progress Notes (Signed)
 [address] [place] [address]             [phone]            Nilan Iddings [id number] 07-27-1960   History of Present Illness:  Matthew Bradley is a 63 year old man with medical history of hypertension, heavy alcohol use, hyperlipidemia and current cigarette smoker who presents for initial encounter of ascending thoracic aortic aneurysm.  This was initially found on a low-dose CT scan for lung cancer screening in April 2025.  CT scan measured aneurysm at 4.5 cm.  He presents to the clinic today and reports that he has been doing well.  He was seen by his PCP on 11/19/2023 and blood pressure was slightly elevated at that visit.  He was started on carvedilol  to help with blood pressure.  He states that his blood pressure today is the lowest he has seen it recently.  He is currently smoking cigarettes and was prescribed Chantix  which he is taking.  He has dropped from 1 pack/day to roughly 6 to 7 cigarettes.  He  denies chest pain, shortness of breath and lower leg swelling   Current Outpatient Medications on File Prior to Visit  Medication Sig Dispense Refill   albuterol  (VENTOLIN  HFA) 108 (90 Base) MCG/ACT inhaler Inhale 2 puffs into the lungs every 4 (four) hours as needed for wheezing or shortness of breath (or coughing). 18 g 0   amLODipine  (NORVASC ) 10 MG tablet TAKE 1 TABLET BY MOUTH EVERY DAY 90 tablet 1   atorvastatin  (LIPITOR) 40 MG tablet Take 1 tablet (40 mg total) by mouth daily. 30 tablet 11   carvedilol  (COREG ) 3.125 MG tablet Take 1 tablet (3.125 mg total) by mouth 2 (two) times daily. 60 tablet 3   diclofenac  sodium (VOLTAREN ) 1 % GEL Apply 4 g topically 4 (four) times daily. 100 g 0   Ipratropium-Albuterol  (ALBUTEROL -IPRATROPIUM IN) Inhale into the lungs.     naltrexone  (DEPADE) 50 MG tablet Take 1 tablet (50 mg total) by mouth daily. 30 tablet 3   No current facility-administered medications on file prior to visit.      ROS: Review of Systems  Constitutional: Negative.  Negative for malaise/fatigue.  Respiratory: Negative.  Negative for cough and shortness of breath.   Cardiovascular: Negative.  Negative for chest pain and leg swelling.  Neurological: Negative.  Negative for dizziness.     BP 130/81   Pulse 84   Resp 20   Ht 6' 5 (1.956 m)   Wt 259 lb (117.5 kg)   SpO2 95% Comment: RA  BMI 30.71 kg/m   Physical Exam Constitutional:      Appearance: Normal appearance.  HENT:     Head: Normocephalic and atraumatic.  Cardiovascular:     Rate and Rhythm: Normal rate and regular rhythm.     Heart sounds: Normal heart sounds, S1 normal and S2 normal.  Pulmonary:     Effort: Pulmonary effort is normal.     Breath sounds: Normal breath sounds.  Skin:    General: Skin is warm and dry.  Neurological:     General: No focal deficit present.     Mental Status: He is alert and oriented to person, place, and time.      Imaging: CLINICAL DATA:  Current 41 pack-year smoker.   EXAM: CT CHEST WITHOUT CONTRAST LOW-DOSE FOR LUNG CANCER SCREENING   TECHNIQUE: Multidetector CT imaging of  the chest was performed following the standard protocol without IV contrast.   RADIATION DOSE REDUCTION: This exam was performed according to the departmental dose-optimization program which includes automated exposure control, adjustment of the mA and/or kV according to patient size and/or use of iterative reconstruction technique.   COMPARISON:  None Available.   FINDINGS: Cardiovascular: Heart is at the upper limits of normal in size. No pericardial effusion. Ascending aorta measures 4.5 cm (coronal image 212). Enlarged pulmonic trunk.   Mediastinum/Nodes: No pathologically enlarged mediastinal or axillary lymph nodes. Hilar regions are difficult to definitively evaluate without IV contrast. Esophagus is grossly unremarkable.   Lungs/Pleura: Centrilobular and paraseptal emphysema. Smoking related  respiratory bronchiolitis. No suspicious pulmonary nodules. No pleural fluid. Airway is unremarkable.   Upper Abdomen: Visualized portions of the liver, adrenal glands, left kidney, spleen, pancreas, stomach and bowel are grossly unremarkable. No upper abdominal adenopathy.   Musculoskeletal: Degenerative changes in the spine.   IMPRESSION: 1. Lung-RADS 1, negative. Continue annual screening with low-dose chest CT without contrast in 12 months. 2. 4.5 cm ascending aortic aneurysm. Ascending thoracic aortic aneurysm. Recommend semi-annual imaging followup by CTA or MRA and referral to cardiothoracic surgery if not already obtained. This recommendation follows 2010 ACCF/AHA/AATS/ACR/ASA/SCA/SCAI/SIR/STS/SVM Guidelines for the Diagnosis and Management of Patients With Thoracic Aortic Disease. Circulation. 2010; 121: Z733-z630. Aortic aneurysm NOS (ICD10-I71.9). 3. Enlarged pulmonic trunk, indicative of pulmonary arterial hypertension. 4.  Emphysema (ICD10-J43.9).     Electronically Signed   By: Newell Eke M.D.   On: 05/22/2023 13:28      A/P:  Aneurysm of ascending aorta without rupture -4.5 cm ascending thoracic aortic aneurysm on low-dose CT scan of chest.  -We discussed the natural history and and risk factors for growth of ascending aortic aneurysms. Discussed recommendations to minimize the risk of further expansion or dissection including careful blood pressure control, avoidance of contact sports and heavy lifting, attention to lipid management.  We covered the importance of smoking cessation.  The patient does not yet meet surgical criteria of >5.5cm. The patient is aware of signs and symptoms of aortic dissection and when to present to the emergency department   -Follow up in 6 months with CTA of chest for continued surveillance    Risk Modification:  Statin:  atorvastatin   Smoking cessation instruction/counseling given:  counseled patient on the dangers of  tobacco use, advised patient to stop smoking, and reviewed strategies to maximize success. Continue with chantix as prescribed  Patient was counseled on importance of Blood Pressure Control  They are instructed to contact their Primary Care Physician if they start to have blood pressure readings over 130s/90s. Do not ever stop blood pressure medications on your own, unless instructed by healthcare professional.  Please avoid use of Fluoroquinolones as this can potentially increase your risk of Aortic Rupture and/or Dissection  Patient educated on signs and symptoms of Aortic Dissection, handout also provided in AVS  Manuelita CHRISTELLA Rough, PA-C 12/03/23

## 2023-12-03 NOTE — Patient Instructions (Signed)
 Risk Modification in those with ascending thoracic aortic aneurysm:   Continue control of blood pressure (prefer BP 130/80 or less)   2. Avoid fluoroquinolone antibiotics (I.e Ciprofloxacin, Avelox, Levofloxacin, Ofloxacin)   3.  Use of statin (to decrease cardiovascular risk)   4.  Exercise and activity limitations is individualized, but in general, contact sports are to be avoided and one should avoid heavy lifting (defined as half of ideal body weight) and exercises involving sustained Valsalva maneuver.   5.  Follow-up in 6 with CTA chest.  OK to use a non-contrast CT if you have had a recent study for surveillance of the lung nodule.

## 2023-12-09 ENCOUNTER — Telehealth: Payer: Self-pay | Admitting: *Deleted

## 2023-12-09 MED ORDER — AMLODIPINE BESYLATE 10 MG PO TABS: 10.0000 mg | ORAL_TABLET | Freq: Every day | ORAL | 1 refills | Status: AC

## 2023-12-09 NOTE — Telephone Encounter (Signed)
 Med refill

## 2024-01-11 ENCOUNTER — Ambulatory Visit: Admitting: Student

## 2024-01-11 NOTE — Progress Notes (Deleted)
" ° °  Established Patient Office Visit  Subjective   Patient ID: Matthew Bradley, male    DOB: [birth year]  Age: 63 y.o. MRN: [id number]  No chief complaint on file.   [name] is a 63 y.o. male with past medical history of hypertension, aneurysm of ascending aorta without rupture, heavy alcohol use, hyperlipidemia, tobacco use presents today for follow-up per the last office visit on 11/19/2023.  Patient takes the following medications: Hypertension: Amlodipine  10 mg daily and Coreg  3.125 mg twice daily Hyperlipidemia: Atorvastatin  40 mg daily AUD: Naltrexone  50 mg daily TUD: Chantix   Review of Systems:  As per assessment and Plan   Objective:     There were no vitals filed for this visit. ***  Physical Exam General: Sitting in chair, no acute distress Cardiovascular: Regular rate Pulmonary: Breathing comfortably Abdomen: Soft, nontender, nondistended MSK: No lower extremity edema bilaterally  {Labs (Optional):23779}  The 10-year ASCVD risk score (Arnett DK, et al., 2019) is: 22%    Assessment & Plan:   Patient {GC/GE:3044014::discussed with,seen with} Dr. {NAMES:[id number]::Chambliss,Chun,Hoffman,Lau,Machen,Narendra,Williams,Winfrey}  Hypertension: BP Readings from Last 3 Encounters:  12/03/23 130/81  11/19/23 (!) 149/90  03/24/23 (!) 146/96   - Denies any chest pain, shortness of breath, lower extremity swelling, headaches, vision changes - Patient is currently taking Coreg  3.125 mg twice daily and amlodipine  10 mg daily   AUD Patient reports that he is drinking:  Last drink on Currently taking naltrexone  50 mg daily Resources provided  TUD Patient reports smoking: Patient used to smoke: Pack year history: CT lung cancer screening done on April 2025 > Lung RADS 1, negative, continue annual screening  Currently taking Chantix  Advised to continue Chantix   Aortic aneurysm 4.5 cm noted on CT lung on April 2025 Patient was seen  vascular surgery on 12/03/2023, did not meet the surgical criteria (> 5.5 cm) Patient was scheduled for an CTA of chest in 6 months for continued surveillance Patient was advised to continue statin, smoking cessation and blood pressure control   Problem List Items Addressed This Visit   None   No follow-ups on file.    Matthew Edwards, DO "

## 2024-01-29 ENCOUNTER — Encounter: Payer: Self-pay | Admitting: Student

## 2024-02-02 ENCOUNTER — Ambulatory Visit: Payer: Self-pay | Admitting: Student

## 2024-02-02 ENCOUNTER — Telehealth: Payer: Self-pay | Admitting: *Deleted

## 2024-02-02 ENCOUNTER — Encounter: Payer: Self-pay | Admitting: Student

## 2024-02-02 ENCOUNTER — Encounter: Payer: Self-pay | Admitting: Gastroenterology

## 2024-02-02 ENCOUNTER — Other Ambulatory Visit: Payer: Self-pay

## 2024-02-02 VITALS — BP 151/100 | HR 96 | Temp 98.6°F | Ht 77.0 in | Wt 253.6 lb

## 2024-02-02 DIAGNOSIS — N5201 Erectile dysfunction due to arterial insufficiency: Secondary | ICD-10-CM | POA: Diagnosis present

## 2024-02-02 DIAGNOSIS — I10 Essential (primary) hypertension: Secondary | ICD-10-CM

## 2024-02-02 DIAGNOSIS — R052 Subacute cough: Secondary | ICD-10-CM

## 2024-02-02 DIAGNOSIS — F17210 Nicotine dependence, cigarettes, uncomplicated: Secondary | ICD-10-CM

## 2024-02-02 DIAGNOSIS — Z91148 Patient's other noncompliance with medication regimen for other reason: Secondary | ICD-10-CM

## 2024-02-02 DIAGNOSIS — F1090 Alcohol use, unspecified, uncomplicated: Secondary | ICD-10-CM

## 2024-02-02 LAB — POCT GLYCOSYLATED HEMOGLOBIN (HGB A1C): Hemoglobin A1C: 5.6 % (ref 4.0–5.6)

## 2024-02-02 LAB — GLUCOSE, CAPILLARY: Glucose-Capillary: 101 mg/dL — ABNORMAL HIGH (ref 70–99)

## 2024-02-02 MED ORDER — BENZONATATE 100 MG PO CAPS: 100.0000 mg | ORAL_CAPSULE | Freq: Three times a day (TID) | ORAL | 0 refills | Status: AC | PRN

## 2024-02-02 MED ORDER — TADALAFIL 10 MG PO TABS: 10.0000 mg | ORAL_TABLET | ORAL | 1 refills | Status: AC | PRN

## 2024-02-02 MED ORDER — VARENICLINE TARTRATE 0.5 MG PO TABS: ORAL_TABLET | ORAL | 0 refills | Status: AC

## 2024-02-02 MED ORDER — BLOOD PRESSURE MONITOR DEVI: 0 refills | Status: AC

## 2024-02-02 MED ORDER — VARENICLINE TARTRATE 1 MG PO TABS: 1.0000 mg | ORAL_TABLET | Freq: Two times a day (BID) | ORAL | 0 refills | Status: AC

## 2024-02-02 NOTE — Assessment & Plan Note (Signed)
 He was given Chantix  at previous visit.  He states he has it at home, but never started it because he thinks he takes too many medications.  Discussed the benefits of smoking cessation.  He agreed to start taking Chantix   Plan: - Start Chantix  - 6 minutes spent on tobacco cessation counseling

## 2024-02-02 NOTE — Assessment & Plan Note (Signed)
 Patient with past medical history of alcohol use.  He drinks about 40 ounces of beer a day.  He states he is ready to quit.  Patient was given naltrexone  previously, but stated he did not want to take it because he has had a lot of medications to take.  Counseled him on this today.  He agreed to take this medication  Plan: - Education provided on alcohol cessation - Continue naltrexone  50 mg daily

## 2024-02-02 NOTE — Telephone Encounter (Signed)
 Copied from CRM [id number]. Topic: Clinical - Lab/Test Results >> Feb 02, 2024  3:22 PM Miquel SAILOR wrote: Reason for CRM: PT returning call but no notes. He said if about his labs result fro 01/20 he will discuss on 02/03 app. No further questions

## 2024-02-02 NOTE — Progress Notes (Signed)
 PCP requested BP cuff ordered. Patient eligible for BP cuff via Medicaid insurance. Orders placed to [facility] for delivery.   Lorain Baseman, PharmD [facility] [phone]

## 2024-02-02 NOTE — Patient Instructions (Addendum)
 Matthew Bradley, Thank you for allowing me to take part in your care today.  Here are your instructions.  1.  I have sent you in Cialis .  Please take this 2 hours prior to sexual intercourse.  2.  Lets focus on stopping the smoking and stopping alcohol use.  This should also help with your erectile dysfunction.  3.  I will see you back in 2 weeks.   PLEASE BRING YOUR MEDICATIONS TO EVERY APPOINTMENT  Thank you, Dr. Tobie  If you have any other questions please contact the internal medicine clinic at [phone] If it is after hours, please call the [facility] at [phone] and then ask the person who picks up for the resident on call.

## 2024-02-02 NOTE — Progress Notes (Signed)
 "  CC: Follow up appointment  HPI:  Matthew Bradley is a 64 y.o. Male with a past medical history of primary hypertension, alcohol use, hyperlipidemia who presents for acute visit regarding cough and congestion.  Please see assessment and plan for full HPI.  Medications: Asthma: Albuterol  2 puffs every 4 hours as needed, DuoNebs Hypertension: Amlodipine  10 mg daily, carvedilol  3.125 mg twice daily Hyperlipidemia: Lipitor 40 mg daily Pain: Voltaren  gel Alcohol use: Naltrexone  50 mg daily Tobacco use disorder: Chantix   Past Medical History:  Diagnosis Date   Hypertension     Current Medications[1]  Review of Systems:    Negative except for what is stated in HPI  Physical Exam:  Vitals:   02/02/24 0926  BP: (!) 151/100  Pulse: 96  Temp: 98.6 F (37 C)  TempSrc: Oral  SpO2: 100%  Weight: 253 lb 9.6 oz (115 kg)  Height: 6' 5 (1.956 m)   General: Patient is sitting comfortably in the room  Head: Normocephalic, atraumatic Cardio: Regular rate and rhythm, no murmurs, rubs or gallops Pulmonary: Clear to ausculation bilaterally with no rales, rhonchi, and crackles    Assessment & Plan:   Assessment & Plan Erectile dysfunction due to arterial insufficiency Patient presents with concerns of erectile dysfunction.  He reports having some morning erections, but is unable to obtain an erection when he is getting ready for sexual intercourse.  He states this has never happened to him before.  His libido is still present, but he is unable to maintain erection.  This is likely in the setting of vascular issues in the setting of hypertension, tobacco use, and alcohol use.  I encouraged him to be adherent to his medications.  I also think it will be safe for him to trial Cialis .  Obtain A1c today to evaluate for diabetes given risk factors A1c is 5.6.  Plan: - Patient encouraged to stay adherent to medications - Trial Cialis  10 mg as needed - A1c 5.6, patient is not  diabetic Subacute cough Patient reports he had a viral infection at the beginning of the year.  He states he has had a cough since.  He denies any fevers, chills, night sweats, or sick contacts.  This is likely a postviral cough.  Will give benzonatate .  Plan: - Benzonatate  100 mg as needed - Patient is improving, will continue to monitor Primary hypertension Patient has a past medical history of hypertension.  Current antihypertensives at this time include amlodipine  10 mg daily as well as carvedilol  3.125 mg daily.  He is not adherent to his medications.  I discussed this with him today.  He has an aneurysm history and smoking history.  This puts him at risk for further complications moving forward.  Also informed him that this could be related to his ED.  He is seeing me in 2 weeks, and we had shared decision making and he would like to try his medications and check his blood pressures and return in 2 weeks.  I reached out to my pharmacist to see if he can get a blood pressure cuff.  She states she will get in 1.  Plan: - Continue amlodipine  10 mg daily - Continue carvedilol  3.125 mg daily - Follow-up in 2 weeks - Pharmacist to help patient acquire blood pressure cuff, patient encouraged to maintain a log Heavy alcohol use Patient with past medical history of alcohol use.  He drinks about 40 ounces of beer a day.  He states he is ready to  quit.  Patient was given naltrexone  previously, but stated he did not want to take it because he has had a lot of medications to take.  Counseled him on this today.  He agreed to take this medication  Plan: - Education provided on alcohol cessation - Continue naltrexone  50 mg daily Cigarette smoker He was given Chantix  at previous visit.  He states he has it at home, but never started it because he thinks he takes too many medications.  Discussed the benefits of smoking cessation.  He agreed to start taking Chantix   Plan: - Start Chantix  - 6 minutes  spent on tobacco cessation counseling Nonadherence to medication Patient reports nonadherence to his medications.  When inquiring about this, he states he feels like he is taking too many medications.  Educated patient on importance of taking medications today.  He understood this and would be willing to be more adherent.   Patient discussed with Dr. Lovie Libby Blanch, DO Internal Medicine Resident PGY-3     [1]  Current Outpatient Medications:    benzonatate  (TESSALON ) 100 MG capsule, Take 1 capsule (100 mg total) by mouth 3 (three) times daily as needed for cough., Disp: 30 capsule, Rfl: 0   tadalafil  (CIALIS ) 10 MG tablet, Take 1 tablet (10 mg total) by mouth as needed for erectile dysfunction., Disp: 20 tablet, Rfl: 1   varenicline  (CHANTIX  CONTINUING MONTH PAK) 1 MG tablet, Take 1 tablet (1 mg total) by mouth 2 (two) times daily., Disp: 180 tablet, Rfl: 0   varenicline  (CHANTIX ) 0.5 MG tablet, Choose a quit date.  7 days prior to your quit date start taking Chantix . Days 1 to 3: Take one pill (0.5 mg) each day. Days 4 to 7: Take two pills (0.5 mg each) per day - one in the morning and one in the evening. Days 8 until the end of treatment: Take 2 pills (1 mg total) twice daily - one in the morning and one in the evening., Disp: 12 tablet, Rfl: 0   albuterol  (VENTOLIN  HFA) 108 (90 Base) MCG/ACT inhaler, Inhale 2 puffs into the lungs every 4 (four) hours as needed for wheezing or shortness of breath (or coughing)., Disp: 18 g, Rfl: 0   amLODipine  (NORVASC ) 10 MG tablet, Take 1 tablet (10 mg total) by mouth daily., Disp: 90 tablet, Rfl: 1   atorvastatin  (LIPITOR) 40 MG tablet, Take 1 tablet (40 mg total) by mouth daily., Disp: 30 tablet, Rfl: 11   Blood Pressure Monitor DEVI, Use as directed to monitor blood pressure. Goal less than 130/80 mmHg., Disp: 1 each, Rfl: 0   carvedilol  (COREG ) 3.125 MG tablet, Take 1 tablet (3.125 mg total) by mouth 2 (two) times daily., Disp: 60 tablet, Rfl: 3    diclofenac  sodium (VOLTAREN ) 1 % GEL, Apply 4 g topically 4 (four) times daily., Disp: 100 g, Rfl: 0   Ipratropium-Albuterol  (ALBUTEROL -IPRATROPIUM IN), Inhale into the lungs., Disp: , Rfl:    naltrexone  (DEPADE) 50 MG tablet, Take 1 tablet (50 mg total) by mouth daily., Disp: 30 tablet, Rfl: 3  "

## 2024-02-02 NOTE — Assessment & Plan Note (Signed)
 Patient has a past medical history of hypertension.  Current antihypertensives at this time include amlodipine  10 mg daily as well as carvedilol  3.125 mg daily.  He is not adherent to his medications.  I discussed this with him today.  He has an aneurysm history and smoking history.  This puts him at risk for further complications moving forward.  Also informed him that this could be related to his ED.  He is seeing me in 2 weeks, and we had shared decision making and he would like to try his medications and check his blood pressures and return in 2 weeks.  I reached out to my pharmacist to see if he can get a blood pressure cuff.  She states she will get in 1.  Plan: - Continue amlodipine  10 mg daily - Continue carvedilol  3.125 mg daily - Follow-up in 2 weeks - Pharmacist to help patient acquire blood pressure cuff, patient encouraged to maintain a log

## 2024-02-02 NOTE — Telephone Encounter (Signed)
 Patient was already Referred to the following office:    Type Date User Summary Attachment  General 11/19/2023 10:37 AM Shirlean Brunetta FALCON [facility] -  Note: [facility] Gastroenterologist in [place], [place]  Located in: Donna MANO [facility] [address] Address: [address], [place], [place] [address] Phone: [phone]  Copied from CRM [id number]. Topic: Referral - Question >> Jan 28, 2024 10:45 AM DeAngela L wrote: Reason for CRM: Gerand with [employer] calling on behalf of the patient with the patient on the line also and asking if the doctor office and submit a referral for the patient to get a colonoscopy    Patient num  [phone]

## 2024-02-02 NOTE — Assessment & Plan Note (Signed)
 Patient reports nonadherence to his medications.  When inquiring about this, he states he feels like he is taking too many medications.  Educated patient on importance of taking medications today.  He understood this and would be willing to be more adherent.

## 2024-02-03 NOTE — Progress Notes (Signed)
 Internal Medicine Clinic Attending  Case discussed with the resident at the time of the visit.  We reviewed the resident's history and exam and pertinent patient test results.  I agree with the assessment, diagnosis, and plan of care documented in the resident's note.

## 2024-02-05 ENCOUNTER — Telehealth: Payer: Self-pay | Admitting: Pharmacy Technician

## 2024-02-05 NOTE — Progress Notes (Signed)
" ° °  02/05/2024 Name: Matheson Vandehei MRN: [id number] DOB: 1960-06-29  PharmD received referral from PCP in regard to medication access for a BP  cuff on 02/02/2024. CPhT contacted pharmacy today to discuss medication access.   Plan from last clinical pharmacist appointment : PCP requested BP cuff ordered. Patient eligible for BP cuff via Medicaid insurance. Orders placed to [facility] for delivery. (copy/paste from last note)   Medication Adherence Barriers Identified:  Access issues with any new medication or testing device: Yes BP monitor/cuff   Medication Adherence Barriers Addressed/Actions Taken:  Reviewed medication changes per plan from last clinical pharmacist note Medication Access for BP monitor/cuff Will discuss medication access concerns with pharmacist Contacted pharmacy regarding new prescriptions Spoke to [facility] staff member. Staff member informs order was received and delivered to the address we have on file in EPIC demographics Called patient. HIPAA verified. Patient informs he received the BP monitor/cuff. Patient informs he has transportation set up for his next PCP appointment and he will attend the appointment. Reviewed instructions for monitoring blood pressures at home and reminded patient to keep a written log to review with pharmacist Reminded patient of date/time of upcoming clinical pharmacist follow up and any upcoming PCP/specialists visits. Patient denies transportation barriers to the appointment. No. Patient informs he has transportation services set up for his appointment with PCP on 02/19/23.  Next clinical pharmacist appointment is scheduled for: TBD   Kate Caddy, CPhT [facility] Office: [phone] Email: [email or web address]  "

## 2024-02-16 ENCOUNTER — Other Ambulatory Visit: Payer: Self-pay | Admitting: Student

## 2024-02-16 ENCOUNTER — Encounter: Payer: Self-pay | Admitting: Student

## 2024-02-16 ENCOUNTER — Ambulatory Visit: Payer: Self-pay | Admitting: Student

## 2024-02-16 ENCOUNTER — Other Ambulatory Visit: Payer: Self-pay

## 2024-02-16 VITALS — BP 144/84 | HR 78 | Temp 98.7°F | Ht 77.0 in | Wt 252.4 lb

## 2024-02-16 DIAGNOSIS — I10 Essential (primary) hypertension: Secondary | ICD-10-CM | POA: Diagnosis present

## 2024-02-16 DIAGNOSIS — F1090 Alcohol use, unspecified, uncomplicated: Secondary | ICD-10-CM | POA: Diagnosis not present

## 2024-02-16 DIAGNOSIS — N5201 Erectile dysfunction due to arterial insufficiency: Secondary | ICD-10-CM | POA: Insufficient documentation

## 2024-02-16 DIAGNOSIS — F17210 Nicotine dependence, cigarettes, uncomplicated: Secondary | ICD-10-CM

## 2024-02-16 DIAGNOSIS — Z1211 Encounter for screening for malignant neoplasm of colon: Secondary | ICD-10-CM

## 2024-02-16 MED ORDER — CARVEDILOL 6.25 MG PO TABS: 6.2500 mg | ORAL_TABLET | Freq: Two times a day (BID) | ORAL | 3 refills | Status: AC

## 2024-02-16 NOTE — Assessment & Plan Note (Addendum)
 At previous visit patient concerns for trouble maintaining an erection.  Patient was started on Cialis .  Patient endorses he has not tried this yet.  Plan: - Encourage patient to try Cialis 

## 2024-02-16 NOTE — Telephone Encounter (Signed)
 Medication discontinued 02/16/24

## 2024-02-16 NOTE — Assessment & Plan Note (Signed)
 Patient is down to 5 cigarettes a day.  Encouraged him to continue with Chantix .  He states his cravings have decreased.  Encouraged him to stop smoking as he was to stop smoking once he started the Chantix .  Plan: - Continue Chantix  - 4 minutes spent on tobacco cessation counseling

## 2024-02-16 NOTE — Assessment & Plan Note (Addendum)
 Refer for colonoscopy for colon cancer screening.

## 2024-02-16 NOTE — Patient Instructions (Addendum)
 Matthew Bradley, Thank you for allowing me to take part in your care today.  Here are your instructions.  1.  Increase your carvedilol  to 6.25 mg daily.  2.  Keep a blood pressure log.  Bring it back next time filled out.  3.  I have referred you for a colonoscopy.  4. Try the Cialis  to see if it works or not  5. Please come back in 1 month    PLEASE BRING YOUR MEDICATIONS TO EVERY APPOINTMENT  Thank you, Dr. Tobie  If you have any other questions please contact the internal medicine clinic at [phone] If it is after hours, please call the [place] hospital at [phone] and then ask the person who picks up for the resident on call.

## 2024-02-16 NOTE — Assessment & Plan Note (Addendum)
 Patient has past medical history of hypertension.  Medications at this time include Amlodipine  10 mg daily, carvedilol  3.125 mg twice daily.  At last office visit patient endorsed not taking his medication and requested a blood pressure cuff.  At that time, I reached out to my pharmacist to see if we can help with blood pressure cuff, and she was able to obtain one for him.  I gave him a log to monitor his blood pressures and he returns today to go over log.  Blood pressures have been measuring 140s-150s.  Plan: - Increase co-reg to 6.25 mg BID - Continue Amlodipine  10 mg daily  - Follow-up 1 month for blood pressure log check

## 2024-02-17 NOTE — Progress Notes (Signed)
 Internal Medicine Clinic Attending  Case discussed with the resident at the time of the visit.  We reviewed the resident's history and exam and pertinent patient test results.  I agree with the assessment, diagnosis, and plan of care documented in the resident's note.
